# Patient Record
Sex: Female | Born: 1980 | Race: White | Hispanic: No | Smoking: Current every day smoker
Health system: Southern US, Community
[De-identification: ages and names within clinical notes are randomized; demographics above are authoritative.]

## PROBLEM LIST (undated history)

## (undated) DIAGNOSIS — G4700 Insomnia, unspecified: Secondary | ICD-10-CM

## (undated) DIAGNOSIS — R569 Unspecified convulsions: Secondary | ICD-10-CM

## (undated) DIAGNOSIS — J45909 Unspecified asthma, uncomplicated: Secondary | ICD-10-CM

## (undated) DIAGNOSIS — F1111 Opioid abuse, in remission: Secondary | ICD-10-CM

## (undated) HISTORY — PX: TONSILLECTOMY: SUR1361

## (undated) HISTORY — DX: Opioid abuse, in remission: F11.11

## (undated) HISTORY — PX: OTHER SURGICAL HISTORY: SHX169

## (undated) HISTORY — PX: TUBAL LIGATION: SHX77

## (undated) HISTORY — DX: Insomnia, unspecified: G47.00

---
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
## 2002-01-06 ENCOUNTER — Inpatient Hospital Stay (HOSPITAL_COMMUNITY): Admission: AD | Admit: 2002-01-06 | Discharge: 2002-01-06 | Payer: Self-pay | Admitting: Obstetrics

## 2002-02-02 ENCOUNTER — Inpatient Hospital Stay (HOSPITAL_COMMUNITY): Admission: AD | Admit: 2002-02-02 | Discharge: 2002-02-04 | Payer: Self-pay | Admitting: Obstetrics

## 2002-09-07 ENCOUNTER — Encounter: Payer: Self-pay | Admitting: Emergency Medicine

## 2002-09-07 ENCOUNTER — Emergency Department (HOSPITAL_COMMUNITY): Admission: EM | Admit: 2002-09-07 | Discharge: 2002-09-07 | Payer: Self-pay | Admitting: Emergency Medicine

## 2003-01-12 ENCOUNTER — Encounter: Payer: Self-pay | Admitting: Emergency Medicine

## 2003-01-12 ENCOUNTER — Emergency Department (HOSPITAL_COMMUNITY): Admission: EM | Admit: 2003-01-12 | Discharge: 2003-01-12 | Payer: Self-pay | Admitting: Emergency Medicine

## 2003-05-17 ENCOUNTER — Emergency Department (HOSPITAL_COMMUNITY): Admission: EM | Admit: 2003-05-17 | Discharge: 2003-05-17 | Payer: Self-pay | Admitting: Emergency Medicine

## 2003-06-17 ENCOUNTER — Ambulatory Visit (HOSPITAL_COMMUNITY): Admission: RE | Admit: 2003-06-17 | Discharge: 2003-06-17 | Payer: Self-pay | Admitting: Otolaryngology

## 2003-06-17 ENCOUNTER — Encounter (INDEPENDENT_AMBULATORY_CARE_PROVIDER_SITE_OTHER): Payer: Self-pay | Admitting: Specialist

## 2003-06-17 ENCOUNTER — Ambulatory Visit (HOSPITAL_BASED_OUTPATIENT_CLINIC_OR_DEPARTMENT_OTHER): Admission: RE | Admit: 2003-06-17 | Discharge: 2003-06-17 | Payer: Self-pay | Admitting: Otolaryngology

## 2003-08-13 ENCOUNTER — Emergency Department (HOSPITAL_COMMUNITY): Admission: EM | Admit: 2003-08-13 | Discharge: 2003-08-13 | Payer: Self-pay | Admitting: Emergency Medicine

## 2003-09-01 ENCOUNTER — Emergency Department (HOSPITAL_COMMUNITY): Admission: EM | Admit: 2003-09-01 | Discharge: 2003-09-01 | Payer: Self-pay | Admitting: Emergency Medicine

## 2003-09-25 ENCOUNTER — Emergency Department (HOSPITAL_COMMUNITY): Admission: EM | Admit: 2003-09-25 | Discharge: 2003-09-25 | Payer: Self-pay | Admitting: Emergency Medicine

## 2003-10-14 ENCOUNTER — Emergency Department (HOSPITAL_COMMUNITY): Admission: EM | Admit: 2003-10-14 | Discharge: 2003-10-14 | Payer: Self-pay | Admitting: Emergency Medicine

## 2003-11-14 ENCOUNTER — Emergency Department (HOSPITAL_COMMUNITY): Admission: EM | Admit: 2003-11-14 | Discharge: 2003-11-14 | Payer: Self-pay | Admitting: Emergency Medicine

## 2003-12-03 ENCOUNTER — Emergency Department (HOSPITAL_COMMUNITY): Admission: EM | Admit: 2003-12-03 | Discharge: 2003-12-03 | Payer: Self-pay | Admitting: Emergency Medicine

## 2003-12-11 ENCOUNTER — Encounter: Admission: RE | Admit: 2003-12-11 | Discharge: 2003-12-11 | Payer: Self-pay | Admitting: Internal Medicine

## 2004-01-01 ENCOUNTER — Emergency Department (HOSPITAL_COMMUNITY): Admission: EM | Admit: 2004-01-01 | Discharge: 2004-01-01 | Payer: Self-pay | Admitting: Emergency Medicine

## 2004-01-13 ENCOUNTER — Emergency Department (HOSPITAL_COMMUNITY): Admission: EM | Admit: 2004-01-13 | Discharge: 2004-01-13 | Payer: Self-pay | Admitting: Emergency Medicine

## 2004-04-10 ENCOUNTER — Emergency Department (HOSPITAL_COMMUNITY): Admission: EM | Admit: 2004-04-10 | Discharge: 2004-04-10 | Payer: Self-pay | Admitting: Emergency Medicine

## 2004-04-13 ENCOUNTER — Emergency Department (HOSPITAL_COMMUNITY): Admission: EM | Admit: 2004-04-13 | Discharge: 2004-04-13 | Payer: Self-pay | Admitting: Emergency Medicine

## 2004-04-20 ENCOUNTER — Inpatient Hospital Stay (HOSPITAL_COMMUNITY): Admission: AD | Admit: 2004-04-20 | Discharge: 2004-04-20 | Payer: Self-pay | Admitting: Obstetrics

## 2004-09-17 ENCOUNTER — Inpatient Hospital Stay (HOSPITAL_COMMUNITY): Admission: AD | Admit: 2004-09-17 | Discharge: 2004-09-17 | Payer: Self-pay | Admitting: Obstetrics

## 2004-09-20 ENCOUNTER — Inpatient Hospital Stay (HOSPITAL_COMMUNITY): Admission: AD | Admit: 2004-09-20 | Discharge: 2004-09-20 | Payer: Self-pay | Admitting: Obstetrics

## 2004-11-05 ENCOUNTER — Inpatient Hospital Stay (HOSPITAL_COMMUNITY): Admission: AD | Admit: 2004-11-05 | Discharge: 2004-11-05 | Payer: Self-pay | Admitting: Obstetrics

## 2004-11-29 ENCOUNTER — Inpatient Hospital Stay (HOSPITAL_COMMUNITY): Admission: AD | Admit: 2004-11-29 | Discharge: 2004-12-01 | Payer: Self-pay | Admitting: Obstetrics

## 2004-11-30 ENCOUNTER — Encounter (INDEPENDENT_AMBULATORY_CARE_PROVIDER_SITE_OTHER): Payer: Self-pay | Admitting: Specialist

## 2004-12-25 ENCOUNTER — Emergency Department (HOSPITAL_COMMUNITY): Admission: EM | Admit: 2004-12-25 | Discharge: 2004-12-25 | Payer: Self-pay | Admitting: Emergency Medicine

## 2005-04-08 ENCOUNTER — Emergency Department (HOSPITAL_COMMUNITY): Admission: EM | Admit: 2005-04-08 | Discharge: 2005-04-08 | Payer: Self-pay | Admitting: Emergency Medicine

## 2005-04-09 ENCOUNTER — Emergency Department (HOSPITAL_COMMUNITY): Admission: EM | Admit: 2005-04-09 | Discharge: 2005-04-09 | Payer: Self-pay | Admitting: Emergency Medicine

## 2005-06-30 ENCOUNTER — Emergency Department (HOSPITAL_COMMUNITY): Admission: EM | Admit: 2005-06-30 | Discharge: 2005-06-30 | Payer: Self-pay | Admitting: Emergency Medicine

## 2005-10-08 ENCOUNTER — Emergency Department (HOSPITAL_COMMUNITY): Admission: EM | Admit: 2005-10-08 | Discharge: 2005-10-08 | Payer: Self-pay | Admitting: *Deleted

## 2006-01-04 ENCOUNTER — Emergency Department (HOSPITAL_COMMUNITY): Admission: EM | Admit: 2006-01-04 | Discharge: 2006-01-04 | Payer: Self-pay | Admitting: Emergency Medicine

## 2006-01-09 ENCOUNTER — Emergency Department (HOSPITAL_COMMUNITY): Admission: EM | Admit: 2006-01-09 | Discharge: 2006-01-09 | Payer: Self-pay | Admitting: Emergency Medicine

## 2006-02-12 ENCOUNTER — Encounter: Admission: RE | Admit: 2006-02-12 | Discharge: 2006-02-12 | Payer: Self-pay | Admitting: General Practice

## 2006-03-05 ENCOUNTER — Encounter: Admission: RE | Admit: 2006-03-05 | Discharge: 2006-03-05 | Payer: Self-pay | Admitting: General Practice

## 2006-04-22 ENCOUNTER — Emergency Department (HOSPITAL_COMMUNITY): Admission: EM | Admit: 2006-04-22 | Discharge: 2006-04-22 | Payer: Self-pay | Admitting: Emergency Medicine

## 2006-05-02 ENCOUNTER — Encounter
Admission: RE | Admit: 2006-05-02 | Discharge: 2006-07-31 | Payer: Self-pay | Admitting: Physical Medicine & Rehabilitation

## 2006-05-05 ENCOUNTER — Ambulatory Visit: Payer: Self-pay | Admitting: Physical Medicine & Rehabilitation

## 2006-05-09 ENCOUNTER — Encounter
Admission: RE | Admit: 2006-05-09 | Discharge: 2006-08-07 | Payer: Self-pay | Admitting: Physical Medicine & Rehabilitation

## 2006-09-15 ENCOUNTER — Emergency Department (HOSPITAL_COMMUNITY): Admission: EM | Admit: 2006-09-15 | Discharge: 2006-09-15 | Payer: Self-pay | Admitting: Emergency Medicine

## 2006-11-02 ENCOUNTER — Emergency Department (HOSPITAL_COMMUNITY): Admission: EM | Admit: 2006-11-02 | Discharge: 2006-11-02 | Payer: Self-pay | Admitting: Emergency Medicine

## 2006-11-23 ENCOUNTER — Emergency Department (HOSPITAL_COMMUNITY): Admission: EM | Admit: 2006-11-23 | Discharge: 2006-11-23 | Payer: Self-pay | Admitting: Emergency Medicine

## 2006-12-03 ENCOUNTER — Emergency Department (HOSPITAL_COMMUNITY): Admission: EM | Admit: 2006-12-03 | Discharge: 2006-12-03 | Payer: Self-pay | Admitting: Emergency Medicine

## 2007-01-14 ENCOUNTER — Emergency Department (HOSPITAL_COMMUNITY): Admission: EM | Admit: 2007-01-14 | Discharge: 2007-01-14 | Payer: Self-pay | Admitting: Emergency Medicine

## 2007-02-09 ENCOUNTER — Emergency Department (HOSPITAL_COMMUNITY): Admission: EM | Admit: 2007-02-09 | Discharge: 2007-02-09 | Payer: Self-pay | Admitting: Emergency Medicine

## 2007-03-14 ENCOUNTER — Emergency Department (HOSPITAL_COMMUNITY): Admission: EM | Admit: 2007-03-14 | Discharge: 2007-03-14 | Payer: Self-pay | Admitting: Emergency Medicine

## 2007-04-25 ENCOUNTER — Emergency Department (HOSPITAL_COMMUNITY): Admission: EM | Admit: 2007-04-25 | Discharge: 2007-04-25 | Payer: Self-pay | Admitting: Emergency Medicine

## 2007-05-11 ENCOUNTER — Emergency Department (HOSPITAL_COMMUNITY): Admission: EM | Admit: 2007-05-11 | Discharge: 2007-05-11 | Payer: Self-pay | Admitting: Emergency Medicine

## 2007-06-02 ENCOUNTER — Emergency Department (HOSPITAL_COMMUNITY): Admission: EM | Admit: 2007-06-02 | Discharge: 2007-06-02 | Payer: Self-pay | Admitting: Emergency Medicine

## 2007-08-12 ENCOUNTER — Emergency Department (HOSPITAL_COMMUNITY): Admission: EM | Admit: 2007-08-12 | Discharge: 2007-08-12 | Payer: Self-pay | Admitting: Emergency Medicine

## 2007-09-21 ENCOUNTER — Emergency Department (HOSPITAL_COMMUNITY): Admission: EM | Admit: 2007-09-21 | Discharge: 2007-09-21 | Payer: Self-pay | Admitting: Emergency Medicine

## 2007-10-07 ENCOUNTER — Emergency Department (HOSPITAL_COMMUNITY): Admission: EM | Admit: 2007-10-07 | Discharge: 2007-10-07 | Payer: Self-pay | Admitting: Emergency Medicine

## 2007-11-28 ENCOUNTER — Emergency Department (HOSPITAL_COMMUNITY): Admission: EM | Admit: 2007-11-28 | Discharge: 2007-11-28 | Payer: Self-pay | Admitting: Emergency Medicine

## 2007-12-25 ENCOUNTER — Emergency Department (HOSPITAL_COMMUNITY): Admission: EM | Admit: 2007-12-25 | Discharge: 2007-12-25 | Payer: Self-pay | Admitting: Emergency Medicine

## 2008-01-06 ENCOUNTER — Emergency Department (HOSPITAL_COMMUNITY): Admission: EM | Admit: 2008-01-06 | Discharge: 2008-01-06 | Payer: Self-pay | Admitting: Emergency Medicine

## 2008-01-08 ENCOUNTER — Emergency Department (HOSPITAL_COMMUNITY): Admission: EM | Admit: 2008-01-08 | Discharge: 2008-01-08 | Payer: Self-pay | Admitting: Emergency Medicine

## 2008-01-09 ENCOUNTER — Emergency Department (HOSPITAL_COMMUNITY): Admission: EM | Admit: 2008-01-09 | Discharge: 2008-01-09 | Payer: Self-pay | Admitting: Emergency Medicine

## 2008-01-12 ENCOUNTER — Emergency Department (HOSPITAL_COMMUNITY): Admission: EM | Admit: 2008-01-12 | Discharge: 2008-01-12 | Payer: Self-pay | Admitting: Internal Medicine

## 2008-01-22 ENCOUNTER — Emergency Department (HOSPITAL_COMMUNITY): Admission: EM | Admit: 2008-01-22 | Discharge: 2008-01-22 | Payer: Self-pay | Admitting: Emergency Medicine

## 2008-02-18 ENCOUNTER — Inpatient Hospital Stay (HOSPITAL_COMMUNITY): Admission: EM | Admit: 2008-02-18 | Discharge: 2008-02-22 | Payer: Self-pay | Admitting: Emergency Medicine

## 2008-04-02 ENCOUNTER — Emergency Department (HOSPITAL_COMMUNITY): Admission: EM | Admit: 2008-04-02 | Discharge: 2008-04-02 | Payer: Self-pay | Admitting: Emergency Medicine

## 2008-04-05 ENCOUNTER — Emergency Department (HOSPITAL_COMMUNITY): Admission: EM | Admit: 2008-04-05 | Discharge: 2008-04-05 | Payer: Self-pay | Admitting: Emergency Medicine

## 2008-05-23 ENCOUNTER — Emergency Department (HOSPITAL_COMMUNITY): Admission: EM | Admit: 2008-05-23 | Discharge: 2008-05-23 | Payer: Self-pay | Admitting: Emergency Medicine

## 2008-05-29 ENCOUNTER — Emergency Department (HOSPITAL_COMMUNITY): Admission: EM | Admit: 2008-05-29 | Discharge: 2008-05-29 | Payer: Self-pay | Admitting: Emergency Medicine

## 2008-05-31 ENCOUNTER — Emergency Department (HOSPITAL_COMMUNITY): Admission: EM | Admit: 2008-05-31 | Discharge: 2008-05-31 | Payer: Self-pay | Admitting: Family Medicine

## 2008-06-27 ENCOUNTER — Encounter: Admission: RE | Admit: 2008-06-27 | Discharge: 2008-07-11 | Payer: Self-pay | Admitting: Anesthesiology

## 2008-07-05 ENCOUNTER — Emergency Department (HOSPITAL_COMMUNITY): Admission: EM | Admit: 2008-07-05 | Discharge: 2008-07-05 | Payer: Self-pay | Admitting: Emergency Medicine

## 2008-07-07 ENCOUNTER — Emergency Department (HOSPITAL_COMMUNITY): Admission: EM | Admit: 2008-07-07 | Discharge: 2008-07-07 | Payer: Self-pay | Admitting: Emergency Medicine

## 2008-07-09 ENCOUNTER — Emergency Department (HOSPITAL_COMMUNITY): Admission: EM | Admit: 2008-07-09 | Discharge: 2008-07-09 | Payer: Self-pay | Admitting: Emergency Medicine

## 2008-07-17 ENCOUNTER — Emergency Department: Payer: Self-pay | Admitting: Emergency Medicine

## 2008-07-22 ENCOUNTER — Emergency Department: Payer: Self-pay | Admitting: Emergency Medicine

## 2008-07-24 ENCOUNTER — Emergency Department (HOSPITAL_COMMUNITY): Admission: EM | Admit: 2008-07-24 | Discharge: 2008-07-24 | Payer: Self-pay | Admitting: Emergency Medicine

## 2008-08-15 ENCOUNTER — Other Ambulatory Visit: Payer: Self-pay

## 2008-08-15 ENCOUNTER — Other Ambulatory Visit: Payer: Self-pay | Admitting: Emergency Medicine

## 2008-08-16 ENCOUNTER — Inpatient Hospital Stay (HOSPITAL_COMMUNITY): Admission: RE | Admit: 2008-08-16 | Discharge: 2008-08-18 | Payer: Self-pay | Admitting: Psychiatry

## 2008-08-16 ENCOUNTER — Ambulatory Visit: Payer: Self-pay | Admitting: Psychiatry

## 2008-09-27 ENCOUNTER — Emergency Department (HOSPITAL_COMMUNITY): Admission: EM | Admit: 2008-09-27 | Discharge: 2008-09-27 | Payer: Self-pay | Admitting: Emergency Medicine

## 2008-12-09 ENCOUNTER — Emergency Department (HOSPITAL_COMMUNITY): Admission: EM | Admit: 2008-12-09 | Discharge: 2008-12-09 | Payer: Self-pay | Admitting: Emergency Medicine

## 2008-12-22 ENCOUNTER — Emergency Department (HOSPITAL_COMMUNITY): Admission: EM | Admit: 2008-12-22 | Discharge: 2008-12-22 | Payer: Self-pay | Admitting: Emergency Medicine

## 2009-03-01 ENCOUNTER — Emergency Department (HOSPITAL_COMMUNITY): Admission: EM | Admit: 2009-03-01 | Discharge: 2009-03-01 | Payer: Self-pay | Admitting: Emergency Medicine

## 2010-05-20 ENCOUNTER — Encounter: Payer: Self-pay | Admitting: Obstetrics

## 2010-05-20 ENCOUNTER — Encounter: Payer: Self-pay | Admitting: Sports Medicine

## 2010-05-20 ENCOUNTER — Encounter: Payer: Self-pay | Admitting: Internal Medicine

## 2010-05-20 ENCOUNTER — Encounter: Payer: Self-pay | Admitting: General Practice

## 2010-08-08 LAB — COMPREHENSIVE METABOLIC PANEL
AST: 27 U/L (ref 0–37)
Albumin: 3.8 g/dL (ref 3.5–5.2)
Alkaline Phosphatase: 53 U/L (ref 39–117)
BUN: 7 mg/dL (ref 6–23)
Chloride: 104 mEq/L (ref 96–112)
GFR calc Af Amer: >60 mL/min (ref >60)
Potassium: 3.7 mEq/L (ref 3.5–5.1)
Total Bilirubin: 0.4 mg/dL (ref 0.3–1.2)
Total Protein: 6.7 g/dL (ref 6.0–8.3)

## 2010-08-08 LAB — RAPID URINE DRUG SCREEN, HOSP PERFORMED
Amphetamines: NOT DETECTED
Benzodiazepines: NOT DETECTED
Cocaine: NOT DETECTED
Tetrahydrocannabinol: NOT DETECTED

## 2010-08-08 LAB — URINALYSIS, ROUTINE W REFLEX MICROSCOPIC
Bilirubin Urine: NEGATIVE
Ketones, ur: NEGATIVE mg/dL
Nitrite: NEGATIVE
Protein, ur: NEGATIVE mg/dL
Urobilinogen, UA: 0.2 mg/dL (ref 0.0–1.0)
pH: 6 (ref 5.0–8.0)

## 2010-08-08 LAB — CBC
Platelets: 207 10*3/uL (ref 150–400)
RDW: 16.1 % — ABNORMAL HIGH (ref 11.5–15.5)
WBC: 11.1 10*3/uL — ABNORMAL HIGH (ref 4.0–10.5)

## 2010-08-08 LAB — POCT PREGNANCY, URINE: Preg Test, Ur: NEGATIVE

## 2010-08-08 LAB — DIFFERENTIAL
Basophils Absolute: 0 10*3/uL (ref 0.0–0.1)
Basophils Relative: 0 % (ref 0–1)
Eosinophils Relative: 1 % (ref 0–5)
Lymphocytes Relative: 24 % (ref 12–46)
Monocytes Absolute: 0.7 10*3/uL (ref 0.1–1.0)
Monocytes Relative: 6 % (ref 3–12)
Neutro Abs: 7.6 10*3/uL (ref 1.7–7.7)

## 2010-08-08 LAB — ETHANOL: Alcohol, Ethyl (B): <5 mg/dL (ref 0–10)

## 2010-08-09 LAB — POCT I-STAT, CHEM 8
Calcium, Ion: 1.14 mmol/L (ref 1.12–1.32)
HCT: 36 % (ref 36.0–46.0)
Hemoglobin: 12.2 g/dL (ref 12.0–15.0)
TCO2: 25 mmol/L (ref 0–100)

## 2010-08-09 LAB — CULTURE, ROUTINE-ABSCESS

## 2010-08-09 LAB — CBC
Hemoglobin: 11.9 g/dL — ABNORMAL LOW (ref 12.0–15.0)
MCHC: 34.3 g/dL (ref 30.0–36.0)
MCV: 89.9 fL (ref 78.0–100.0)
RBC: 3.86 MIL/uL — ABNORMAL LOW (ref 3.87–5.11)
RDW: 14.9 % (ref 11.5–15.5)

## 2010-08-09 LAB — DIFFERENTIAL
Basophils Absolute: 0 10*3/uL (ref 0.0–0.1)
Basophils Relative: 0 % (ref 0–1)
Eosinophils Absolute: 0 10*3/uL (ref 0.0–0.7)
Monocytes Absolute: 0.8 10*3/uL (ref 0.1–1.0)
Monocytes Relative: 6 % (ref 3–12)
Neutro Abs: 12 10*3/uL — ABNORMAL HIGH (ref 1.7–7.7)

## 2010-09-11 NOTE — H&P (Signed)
[name], [name]              ACCOUNT NO.:  [id number]   MEDICAL RECORD NO.:  [id number]          PATIENT TYPE:  INP   LOCATION:  5002                         FACILITY:  [facility]   PHYSICIAN:  Cherylynn Ridges, M.D.    DATE OF BIRTH:  Feb 11, 1981   DATE OF ADMISSION:  02/18/2008  DATE OF DISCHARGE:                              HISTORY & PHYSICAL   IDENTIFICATION AND CHIEF COMPLAINT:  The patient is a 30 year old with a  large left gluteal abscess.   HISTORY OF PRESENT ILLNESS:  The patient has been having pain in this  area for the last several days.  This punctate area on the surrounded  portion of her left gluteus maximus were drained spontaneously, but it  would continue to build up in spite of drainage.  She has had previous  infections with MRSA throughout her body including her groins and on her  extremities, but never has had a large gluteal abscess.  She comes in  now because of pain.  An ultrasound done by the emergency room  physician, Dr. Georgina Quint demonstrates a large abscess and Surgery was  called.   PAST MEDICAL HISTORY:  Significant for MRSA-1.   PAST SURGICAL HISTORY:  She has had a tubal ligation.  Gynecologically,  obstetrically, she is at least gravida 3, para 3, with 3 live children.   MEDICATIONS:  She takes no medications.   ALLERGIES:  She has no known drug allergies to me, but she told  Anesthesia that she do not tolerate TYLENOL WITH CODEINE.   SOCIAL HISTORY:  she is a smoker, is unknown whether or not she takes  any illicit drugs.  She does drink occasionally.   REVIEW OF SYSTEMS:  She has had pain plus fevers and chills, but not  know what her fevers have been.   On exam in the ED, she is afebrile currently.  Other vital signs are  stable.  She looks like she is in distress, tilted up to her right side  because of pain in the left gluteus maximus.  HEENT:  Normocephalic and atraumatic and anicteric.  NECK:  Supple.  CHEST:  Clear to auscultation.  CARDIAC:  Regular rhythm and rate with no murmurs.  ABDOMEN:  Soft, nontender gluteally.  She has a large left gluteal  abscess which is markedly indurated, not as erythematous as will be  expected.  RECTAL:  No palpable fistula or palpable crypt.   LABORATORY STUDIES:  Pending.   IMPRESSION:  Large left gluteal abscess.   PLAN:  To perform an I and D in the operating room.  Because of her  history of MRSA she will get vancomycin on-call and then go for an  incision and drainage.      Cherylynn Ridges, M.D.  Electronically Signed     JOW/MEDQ  D:  02/18/2008  T:  02/19/2008  Job:  [id number]

## 2010-09-11 NOTE — Discharge Summary (Signed)
[name], [name]              ACCOUNT NO.:  [id number]   MEDICAL RECORD NO.:  [id number]          PATIENT TYPE:  INP   LOCATION:  5002                         FACILITY:  [facility]   PHYSICIAN:  Sharlet Salina T. Hoxworth, M.D.DATE OF BIRTH:  [birth year]   DATE OF ADMISSION:  02/18/2008  DATE OF DISCHARGE:  02/22/2008                               DISCHARGE SUMMARY   DISCHARGE PHYSICIAN:  Sharlet Salina T. Hoxworth, MD   PROCEDURE:  Incision and drainage of large left gluteal abscess by Dr.  Lindie Spruce on February 18, 2008.   CONSULTATIONS:  None.   REASON FOR ADMISSION:  Ms. [name] is a 30 year old white female who  presented to the emergency department with a large left gluteal abscess  which she states has been there for the last several days.  She said  that she had had some portions of this wound drained spontaneously,  however, it did not seem to help and in spite of the drainage, the  abscess continued to grow larger.  She states that she has had previous  infections with MRSA throughout her body including her groin and her  extremities, but never one on her buttock region.  An ultrasound was  done in the emergency department which demonstrated a large abscess and  therefore Surgery was consulted.   ADMITTING DIAGNOSIS:  Large left gluteal abscess, probably methicillin-  resistant Staphylococcus aureus.   HOSPITAL COURSE:  The patient was admitted and started on vancomycin and  then taken to the operating room where an incision and drainage of her  left gluteal abscess was performed.  During the procedure, cultures of  this area were obtained.  On postoperative day one half, the patient was  complaining of some pain, otherwise tolerating a diet and doing well.  At this time, her white count was coming down from admission which was  12.3 and had come down to 8.2.  On her first postoperative day, the  packing was left in.  By postoperative day #2, the patient was  continuing to feel better  and improved.  At this time, her packing was  removed and her wound appeared to be clean.  At this time, she was  started on dressing changes and t.i.d. sitz baths.  Over the next  several days, the patient was continued on vancomycin and wound  dressings were continued.  By postoperative day 4, the patient was ready  to go home.  At this time, her wound was clean with a decrease in  induration and no erythema was present.  At this time, cultures did come  back which revealed that the patient did have methicillin-resistant  staph aureus in her wound.  Therefore at this time, the patient was sent  home on tetracycline as well as home health agency was obtained to help  with dressing changes.   DISCHARGE DIAGNOSES:  1. Left gluteal abscess/methicillin-resistant Staphylococcus aureus.  2. Status post incision and drainage.   DISCHARGE MEDICATIONS:  The patient has no home medications, however,  she was sent home with a prescription for tetracycline 500 mg 1 p.o.  q.i.d. for 7 days  and a prescription for Percocet 1-2 tablets q.4 h. as  needed for pain.   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  She has no dietary  restrictions and no activity restrictions.  She is informed that since  this is a MRSA infection, everything at home needs to be thoroughly  cleaned and wiped down.  She is informed that she may shower and  continue her sitz baths at home, however, she is to take the packing out  prior to doing this.  Otherwise, Home Health nurses are given  instructions for 1/4-inch packing gauze to be packed in this wound once  a day while at home.  The patient is to call our office if she begins to  have any worsening pain or drainage from her wound or fever greater than  101.5.  Otherwise, she is to come back to the [facility] to see either  myself or Everardo All in approximately 2 weeks at 2 o'clock for a wound   check.      Letha Cape, PA      Lorne Skeens. Hoxworth, M.D.  Electronically Signed    KEO/MEDQ  D:  02/22/2008  T:  02/22/2008  Job:  [id number]

## 2010-09-11 NOTE — Op Note (Signed)
NAMEONIYA, MANDARINO              ACCOUNT NO.:  [id number]   MEDICAL RECORD NO.:  [id number]          PATIENT TYPE:  INP   LOCATION:  5002                         FACILITY:  [facility]   PHYSICIAN:  Cherylynn Ridges, M.D.    DATE OF BIRTH:  Jul 16, 1980   DATE OF PROCEDURE:  02/18/2008  DATE OF DISCHARGE:                               OPERATIVE REPORT   PREOPERATIVE DIAGNOSIS:  Large left gluteal abscess.   POSTOPERATIVE DIAGNOSIS:  Large left gluteal abscess.   PROCEDURE:  Incision and drainage of large left gluteal abscess.   SURGEON:  Cherylynn Ridges, MD   ANESTHESIA:  General endotracheal.   ESTIMATED BLOOD LOSS:  Less than 20 mL.   COMPLICATIONS:  None.   CONDITION:  Stable.   FINDINGS:  Large left gluteal abscess, which would allow the packing of  almost two full bottles of half-inch Iodoform Nu gauze.   INDICATIONS FOR OPERATION:  The patient is a 30 year old with a large  left gluteal abscess, which has been building up over the last several  days who comes to the OR for incision and drainage.   OPERATION:  The patient was taken to the operating room, initially  placed in the room in the supine position.  After an adequate  endotracheal anesthetic was administered, she was placed on the  operating table in the prone position and then prepped and draped in  usual sterile manner exposing the left gluteal area.   We incised diagonally across the fluctuant area of the large left  gluteal abscess with a #10 blade into an abscess cavity, which  immediately drained mucopurulent yellow and brownish pus.  We used a  Kelly clamp to open up into the pocket that extended sort of towards the  front and also towards the rectum; however, on digital examination  bimanually could not palpate what appeared to be a fistulous tract.  This appeared to be just an abscess going around the anus, but not a  perirectal fistula.   We opened this abscess widely with a Kelly clamp and irrigated  with  saline solution, sent aerobic and anaerobic cultures, then packed with  almost 2 full bottles of half-inch Iodoform Nu gauze.  A dressing was  then applied and the patient taken to recovery room in stable condition.      Cherylynn Ridges, M.D.  Electronically Signed     JOW/MEDQ  D:  02/18/2008  T:  02/19/2008  Job:  [id number]

## 2010-09-11 NOTE — Discharge Summary (Signed)
[name], [name]              ACCOUNT NO.:  [id number]   MEDICAL RECORD NO.:  [id number]          PATIENT TYPE:  IPS   LOCATION:  0300                          [facility]   PHYSICIAN:  Jasmine Pang, M.D. DATE OF BIRTH:  Jan 03, 1981   DATE OF ADMISSION:  08/16/2008  DATE OF DISCHARGE:  08/18/2008                               DISCHARGE SUMMARY   IDENTIFICATION:  This is a 30 year old single white female who was  admitted on a voluntary basis on August 16, 2008.   HISTORY OF PRESENT ILLNESS:  The patient is here wanting to be detoxed  off her opiates.  She has been using the opiates for the past 6 years.  She states that she is ready to stop.  She has been using Vicodin and  Percocet up to 15 a day with her last use being the day before  admission.  She has had no prior rehab treatment.  She denies any  depression or suicidal thoughts.  She denies any other substance use.  For further admission information, see psychiatric admission assessment.   PHYSICAL FINDINGS:  There were no acute physical or medical problems  noted.  She was fully assessed at the [facility] ED.  Her physical exam  was reviewed with no significant findings.  She did receive Cipro for  urinary tract infection.   LABORATORY DATA:  Urine drug screen is positive for opiates.  Urinalysis  shows moderate leukocytes with 7-10 wbc's.  Glucose was 101.  Pregnancy  test was negative.  Hemoglobin was 11.9 and her hematocrit was 35.9.  Alcohol level was less than 5.   HOSPITAL COURSE:  Upon admission, the patient was started on the  clonidine detox protocol.  She was also started on Ambien 5 mg p.o.  q.h.s. p.r.n. insomnia and Cipro 500 mg p.o. b.i.d. for 3 days.  In  individual sessions upon admission, the patient was alert and oriented  x4.  She was appropriate, pleasant.  She was complaining of withdrawal  symptoms including GI upset with nausea and diarrhea.  She was also  complaining of hot and cold  chills and feelings of anxiety.  She had no  suicidal ideation.  She was tolerating the clonidine detox protocol  well.  On August 17, 2008, the patient was depressed and anxious.  She  was having a bad detox with GI upset, sweats, shaking, and cold chills.  She discussed how she gets her opiates.  On August 18, 2008, mental  status had improved markedly from admission status.  Sleep was good,  appetite was good.  Mood was less depressed, less anxious.  Affect  consistent with mood.  There was no suicidal or homicidal ideation.  No  thoughts of self-injurious behavior.  No auditory or visual  hallucinations.  No paranoia or delusions.  Thoughts were logical and  goal-directed, thought content.  No predominant theme.  Cognitive was  grossly intact.  Insight good, judgment good, impulse control good.  She  was having no problems with her withdrawal protocol today.  She wanted  to go home and was felt to  be safe for discharge.  She will be given the  remainder of her clonidine detox medications with instructions to take  home.   DISCHARGE DIAGNOSES:  Axis I:  Opiate dependence.  Axis II:  None.  Axis III:  Urinary tract infection, being treated.  Axis IV:  Moderate (psychosocial problems related to chronic substance  use).  Axis V:  Global assessment of functioning was 55 at discharge.  GAF was  45 upon admission.  GAF highest past year 60-65.   DISCHARGE PLAN:  There was no specific activity level or dietary  restrictions.   POSTHOSPITAL CARE PLANS:  The patient will go to the [facility].  The patient will complete her clonidine detox protocol.  She  will take clonidine 0.1 mg this evening, then 0.1 mg p.o. b.i.d.  tomorrow, then 0.1 mg daily for the following 2 days, then she will  discontinue it and her detox will be complete.      Jasmine Pang, M.D.  Electronically Signed     BHS/MEDQ  D:  08/18/2008  T:  08/18/2008  Job:  [id number]

## 2010-09-11 NOTE — H&P (Signed)
[name], BEVANS              ACCOUNT NO.:  [id number]   MEDICAL RECORD NO.:  [id number]          PATIENT TYPE:  IPS   LOCATION:  0301                          [facility]  [facility]   PHYSICIAN:  Anselm Jungling, MD  DATE OF BIRTH:  [birth year]   DATE OF ADMISSION:  08/16/2008  DATE OF DISCHARGE:                       PSYCHIATRIC ADMISSION ASSESSMENT   This is on a 30 year old female voluntarily admitted on August 16, 2008.   HISTORY OF PRESENT ILLNESS:  The patient is here wanting to be detoxed  off her opiate use, has been using opiates for the past 6 years.  States  that she is ready to stop.  She has been using Vicodin and Percocet up  to about 15 a day with her last use being yesterday.  She has had no  prior rehab treatment.  Denies any depression or suicidal thoughts.  Again is here to get some help.  She denies any other substance use.   PAST PSYCHIATRIC HISTORY:  First admission to [facility].  No current mental health treatment.  Has no prior hospitalizations.   SOCIAL HISTORY:  A 30 year old female.  She lives in [place].  She is  unemployed.  She has 3 children ages 12, 64 and 3.  Currently lives with  her mother.   FAMILY HISTORY:  Mother and sister both use pills.   ALCOHOL/DRUG HISTORY:  The patient denies any alcohol or other substance  use.   PRIMARY CARE Seichi Kaufhold:  [facility].   MEDICAL PROBLEMS:  History of hip dysplasia.  Denies any other physical  problems.   MEDICATIONS:  Has been prescribed opiates in the past.   DRUG ALLERGIES:  NO KNOWN ALLERGIES.   PHYSICAL EXAMINATION:  GENERAL:  This is a young, well-nourished female  fully assessed at [facility] Emergency Department.  Her physical exam  was reviewed with no significant findings.  She did receive Cipro for  urinary tract infection.  VITAL SIGNS:  Temperature of 98.9, 83 heart rate, 18 respirations, blood  pressure is 132/71.   LABORATORY DATA:  Urine drug screen positive  for opiates.  Urinalysis  shows moderate leukocytes with 7-10 WBCs, glucose of 101.  Pregnancy  test is negative.  Hemoglobin 11.9, hematocrit 35.9.  Alcohol level less  than 5.   MENTAL STATUS EXAM:  The patient is resting in bed at this time.  She is  cooperative with fair eye contact, appropriately dressed.  Speech is  soft spoken and clear.  The patient's mood is guilty and depressed.  The  patient gets sad and teary-eyed when talking about her children.  Thought process are coherent and goal directed.  No evidence of any  delusional statements.  Cognitive function is intact.  Her memory  appears intact.  Judgment and insight are good.   AXIS I:  Opiate dependence.  AXIS II:  Deferred.  AXIS III:  Urinary tract infection.  AXIS IV:  Psychosocial problems related to chronic substance use.  AXIS V:  45.   PLAN:  Our plan is to use the clonidine protocol to aid withdrawal  symptoms.  We will  continue to assess comorbidities and assess her level  of support.  Case manager will assess any follow up programs to prevent  relapse.  The patient will be in the red group.  We will continue with  the Cipro for 3 days for the urinary tract infection.  Her tentative  length of stay at this time is 3-5 days.      Landry Corporal, N.P.      Anselm Jungling, MD  Electronically Signed    JO/MEDQ  D:  08/16/2008  T:  08/16/2008  Job:  [id number]
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
## 2010-09-14 NOTE — Op Note (Signed)
NAMEROMONDA, PARKER              ACCOUNT NO.:  [id number]   MEDICAL RECORD NO.:  [id number]          PATIENT TYPE:  INP   LOCATION:  9108                          FACILITY:  [facility]   PHYSICIAN:  Kathreen Cosier, M.D.DATE OF BIRTH:  [birth year]   DATE OF PROCEDURE:  11/29/2004  DATE OF DISCHARGE:                                 OPERATIVE REPORT   DELIVERY NOTE:  The patient is a 30 year old gravida 3, para 2-0-0-2, at  term, [facility] December 01, 2004, admitted fully dilated and +3 station.  From the  time of admission, fetal heart rate did not go above 100, so a vacuum was  applied through one contraction, no pop-off, delivery effected from an LOA  position.  She had a female, Apgar 9 and 9 __________, the fluid was clear.  The placenta was then delivered spontaneously.       BAM/MEDQ  D:  11/29/2004  T:  11/29/2004  Job:  [id number]

## 2010-09-14 NOTE — Op Note (Signed)
NAMEDENISHIA, CITRO              ACCOUNT NO.:  [id number]   MEDICAL RECORD NO.:  [id number]          PATIENT TYPE:  INP   LOCATION:  9108                          [facility]ACILITY[facility]  [facility]   PHYSICIAN:  Kathreen Cosier, M.D.DATE OF BIRTH:  03-16-1981   DATE OF PROCEDURE:  11/30/2004  DATE OF DISCHARGE:                                 OPERATIVE REPORT   OPERATION/PROCEDURE:  Postpartum tubal ligation under general anesthesia.   DESCRIPTION OF PROCEDURE:  With the patient in the supine position, abdomen  prepped and draped, bladder emptied with a straight catheter.  A midline  subumbilical incision one inch long was made and carried down to the fascia.  The fascia cleaned and grasped with two Kochers.  Fascia and the peritoneum  opened with Mayo scissors.  Left tube grasped in the mid portion with a  Babcock clamp and 0 plain suture placed in the portion of the tube within  the clamp and this was tied.  Approximately one inch of tube transected.  Hemostasis satisfactory.  Procedure done in similar fashion to the other  side.  Lap and sponge counts correct.  Abdomen closed in layers, peritoneum  with continuous suture of 0 chromic, fascia continuous suture of 0 Dexon and  skin closed with subcuticular stitch of 4-0 Monocryl.  Blood loss less than  5 mL.       BAM/MEDQ  D:  11/30/2004  T:  11/30/2004  Job:  [id number]

## 2010-09-14 NOTE — Discharge Summary (Signed)
[name], [name]              ACCOUNT NO.:  [id number]   MEDICAL RECORD NO.:  [id number]          PATIENT TYPE:  INP   LOCATION:  9108                          [facility]   PHYSICIAN:  Kathreen Cosier, M.D.DATE OF BIRTH:  1981-02-10   DATE OF ADMISSION:  11/29/2004  DATE OF DISCHARGE:                                 DISCHARGE SUMMARY   The patient is a 30 year old gravida 3, para 2, 0-0-2, [facility] December 04, 2004,  was admitted in labor and fully dilated to +3 station.  Fetal heart from the  time of admission to the hospital to my presence was never greater than 100.  Vacuum was applied through one contraction and she delivered from the LOA  position of a female, Apgar 9, 9.  No episiotomy or laceration.  The patient  got one does of ampicillin right after delivery. She desired sterilization  and on November 30, 2004, underwent postpartum tubal ligation. On the afternoon  of December 01, 2003, she complained of severe right calf pain.  She was sent  for deep venous studies, which did not show any abnormalities.  The pain  disappeared overnight and was discharged home on the second postpartum day,  ambulatory, on a regular diet, on Tylox for pain, to see me in six weeks.   DISCHARGE DIAGNOSES:  1.  Status post normal vaginal delivery at term.  2.  Post partum tubal ligation.       BAM/MEDQ  D:  12/01/2004  T:  12/01/2004  Job:  [id number]

## 2010-09-14 NOTE — Procedures (Signed)
[name], [name]              ACCOUNT NO.:  [id number]   MEDICAL RECORD NO.:  [id number]          PATIENT TYPE:  REC   LOCATION:  TPC                          FACILITY:  [facility]   PHYSICIAN:  Erick Colace, M.D.DATE OF BIRTH:  1981-04-22   DATE OF PROCEDURE:  08/26/2006  DATE OF DISCHARGE:  08/07/2006                               OPERATIVE REPORT   PROCEDURE:  Right sacroiliac joint injection under fluoroscopic  guidance.   INDICATIONS:  Right lower back, buttock pain with positive Fader  maneuver, pain only partially responsive to medication management.   Informed consent was obtained after describing risks and benefits of  procedure to the patient.  These include bleeding, bruising, infection,  loss of bowel or bladder function, temporary permanent paralysis.  She  elects to proceed and has given written consent.  The patient placed  prone on fluoroscopy table.  Betadine prep, sterile drape.  25 gauge 1-  1/2 inch needle was used to incise skin and subcu tissue 1% lidocaine x2  mL.  A 25-gauge 3-inch spinal needle was inserted fluoroscopic guidance,  AP lateral imaging utilized.  Omnipaque 190 x 0.5 mL demonstrated good  joint arthrogram no evidence of intravascular uptake, then solution  containing 1/2 mL of 40 mg/ mL of Depo-Medrol and 1 mL of 2% lidocaine  were injected.  The patient tolerated procedure well.  Pre and post  injection vitals stable.  Pre injection pain level 8/10 and post  injection pain level 7/10.      Erick Colace, M.D.  Electronically Signed     AEK/MEDQ  D:  08/26/2006 17:03:14  T:  08/27/2006 06:57:48  Job:  [id number]

## 2010-09-14 NOTE — Op Note (Signed)
NAME:  [name], [name]                        ACCOUNT NO.:  [id number]   MEDICAL RECORD NO.:  [id number]                   PATIENT TYPE:  AMB   LOCATION:  DSC                                  FACILITY:  [facility]   PHYSICIAN:  Lucky Cowboy, M.D.                    DATE OF BIRTH:  26-Nov-1980   DATE OF PROCEDURE:  06/17/2003  DATE OF DISCHARGE:                                 OPERATIVE REPORT   PREOPERATIVE DIAGNOSIS:  Chronic tonsillitis.   POSTOPERATIVE DIAGNOSIS:  Chronic tonsillitis.   PROCEDURE:  Tonsillectomy.   SURGEON:  Lucky Cowboy, M.D.   ANESTHESIA:  General.   ESTIMATED BLOOD LOSS:  None.   COMPLICATIONS:  None.   INDICATIONS FOR PROCEDURE:  This patient is a 30 year old female who has  been having problems with tonsillitis for the past 1 1/2 years.  They are  chronically painful.  She is not able to eat well.  She has been seen in the  emergency room  for treatment.  She has infections shortly after antibiotic  therapy.  For these reasons, tonsillectomy is performed.   FINDINGS:  The patient was noted to have 3+ bilateral palatine cryptic  tonsils.   PROCEDURE:  The patient was taken to the operating room and placed on the  table in supine position.  She was then placed under general endotracheal  anesthesia and the table rotated counter clockwise 90 degrees.  The neck was  gently extended using a shoulder roll.  The Crowe-Davis mouth gag with a #3  tongue blade was then placed intraorally, opened, and suspended on the Mayo  stand.  Palpation of the soft palate was without evidence of a submucosal  cleft.  The right palatine tonsil was grasped with Allis clamps and directed  inferomedially.  The Harmonic scalpel was then used to resect the tonsils  staying within the peritonsillar space.  The left tonsil was removed in an  identical fashion.  An NG tube was placed down the esophagus for suctioning  of the gastric contents.  The mouth gag was removed noting no damage to  the teeth or soft tissues.  The table was rotated clockwise 90 degrees to its original position.  The  patient was awakened from anesthesia and extubated in the operating room.  She was taken to the post anesthesia unit in stable condition.  There were  no complications.                                               Lucky Cowboy, M.D.    SJ/MEDQ  D:  06/17/2003  T:  06/18/2003  Job:  [id number]   cc:   Kathreen Cosier, M.D.  [address]  [place]  [place] [address]  Fax: [phone]

## 2011-01-28 LAB — URINE MICROSCOPIC-ADD ON

## 2011-01-28 LAB — URINE CULTURE: Colony Count: >=100000

## 2011-01-28 LAB — URINALYSIS, ROUTINE W REFLEX MICROSCOPIC
Ketones, ur: NEGATIVE
Nitrite: POSITIVE — AB
Protein, ur: 100 — AB

## 2011-01-29 LAB — CBC
HCT: 29.6 — ABNORMAL LOW
HCT: 35.3 — ABNORMAL LOW
Platelets: 250
Platelets: 278
RDW: 15.9 — ABNORMAL HIGH
WBC: 8.2

## 2011-01-29 LAB — ANAEROBIC CULTURE

## 2011-01-29 LAB — VANCOMYCIN, TROUGH: Vancomycin Tr: <5 — ABNORMAL LOW

## 2011-01-29 LAB — BASIC METABOLIC PANEL
Chloride: 105
GFR calc Af Amer: >60
GFR calc non Af Amer: >60
Potassium: 3.9
Sodium: 143

## 2011-01-29 LAB — DIFFERENTIAL
Basophils Absolute: 0
Basophils Relative: 0
Eosinophils Relative: 1
Lymphocytes Relative: 16

## 2011-01-29 LAB — CULTURE, ROUTINE-ABSCESS

## 2011-01-30 LAB — WOUND CULTURE

## 2011-02-07 LAB — DIFFERENTIAL
Basophils Relative: 0
Eosinophils Absolute: 0
Eosinophils Relative: 0
Monocytes Relative: 1 — ABNORMAL LOW
Neutrophils Relative %: 97 — ABNORMAL HIGH

## 2011-02-07 LAB — CBC
HCT: 38.7
MCHC: 33.2
MCV: 89.8
Platelets: 239

## 2011-02-07 LAB — D-DIMER, QUANTITATIVE: D-Dimer, Quant: <0.22

## 2011-09-04 ENCOUNTER — Ambulatory Visit (HOSPITAL_COMMUNITY)
Admission: RE | Admit: 2011-09-04 | Discharge: 2011-09-04 | Disposition: A | Discharge status: Home / Self Care | Payer: Self-pay | Source: Ambulatory Visit | Attending: Psychiatry | Admitting: Psychiatry

## 2011-09-04 DIAGNOSIS — R009 Unspecified abnormalities of heart beat: Secondary | ICD-10-CM

## 2011-09-04 DIAGNOSIS — R9431 Abnormal electrocardiogram [ECG] [EKG]: Secondary | ICD-10-CM | POA: Insufficient documentation

## 2011-12-28 ENCOUNTER — Encounter (HOSPITAL_COMMUNITY): Payer: Self-pay | Admitting: *Deleted

## 2011-12-28 ENCOUNTER — Emergency Department (HOSPITAL_COMMUNITY)
Admission: EM | Admit: 2011-12-28 | Discharge: 2011-12-28 | Disposition: A | Discharge status: Home / Self Care | Payer: Self-pay | Attending: Emergency Medicine | Admitting: Emergency Medicine

## 2011-12-28 DIAGNOSIS — L03317 Cellulitis of buttock: Secondary | ICD-10-CM | POA: Insufficient documentation

## 2011-12-28 DIAGNOSIS — F17200 Nicotine dependence, unspecified, uncomplicated: Secondary | ICD-10-CM | POA: Insufficient documentation

## 2011-12-28 DIAGNOSIS — L0231 Cutaneous abscess of buttock: Secondary | ICD-10-CM | POA: Insufficient documentation

## 2011-12-28 DIAGNOSIS — L0390 Cellulitis, unspecified: Secondary | ICD-10-CM

## 2011-12-28 HISTORY — DX: Unspecified asthma, uncomplicated: J45.909

## 2011-12-28 MED ORDER — IBUPROFEN 800 MG PO TABS
800.0000 mg | ORAL_TABLET | Freq: Once | ORAL | Status: AC
  Administered 2011-12-28: 800 mg via ORAL
  Filled 2011-12-28: qty 1

## 2011-12-28 MED ORDER — CEPHALEXIN 500 MG PO CAPS: 500.0000 mg | ORAL_CAPSULE | Freq: Four times a day (QID) | ORAL | Status: AC

## 2011-12-28 MED ORDER — IBUPROFEN 800 MG PO TABS: 800.0000 mg | ORAL_TABLET | Freq: Three times a day (TID) | ORAL | Status: AC

## 2011-12-28 MED ORDER — SULFAMETHOXAZOLE-TRIMETHOPRIM 800-160 MG PO TABS: 1.0000 | ORAL_TABLET | Freq: Two times a day (BID) | ORAL | Status: AC

## 2011-12-28 NOTE — ED Provider Notes (Signed)
Medical screening examination/treatment/procedure(s) were performed by non-physician practitioner and as supervising physician I was immediately available for consultation/collaboration.  Juliet Rude. Rubin Payor, MD 12/28/11 [phone]

## 2011-12-28 NOTE — ED Provider Notes (Signed)
History     CSN: [id number]  Arrival date & time 12/28/11  4098   First MD Initiated Contact with Patient 12/28/11 0809      Chief Complaint  Patient presents with  . Abscess    (Consider location/radiation/quality/duration/timing/severity/associated sxs/prior treatment) HPI  Patient to ED for abscess to left buttock. She said it started two days ago. She has a history of abscesses that have had to be surgically drained. This time she says she came in early. It hurts her to sit down and to touch the area. It feels warm to the touch. She has not seen any drainage of blood or pus. She had not had vomiting, nausea, diarrhea, fatigue, fevers or chills. NAD/VSS  Past Medical History  Diagnosis Date  . Asthma     Past Surgical History  Procedure Date  . Tubal ligation   . Tonsillectomy   . Abscess surgery     No family history on file.  History  Substance Use Topics  . Smoking status: Current Everyday Smoker  . Smokeless tobacco: Not on file  . Alcohol Use:     OB History    Grav Para Term Preterm Abortions TAB SAB Ect Mult Living                  Review of Systems   HEENT: denies blurry vision or change in hearing PULMONARY: Denies difficulty breathing and SOB CARDIAC: denies chest pain or heart palpitations MUSCULOSKELETAL:  denies being unable to ambulate ABDOMEN AL: denies abdominal pain GU: denies loss of bowel or urinary control NEURO: denies numbness and tingling in extremities SKIN: + rash to buttocks PSYCH: patient denies anxiety or depression. NECK: Pt denies having neck pain     Allergies  Review of patient's allergies indicates no known allergies.  Home Medications   Current Outpatient Rx  Name Route Sig Dispense Refill  . CEPHALEXIN 500 MG PO CAPS Oral Take 1 capsule (500 mg total) by mouth 4 (four) times daily. 28 capsule 0  . IBUPROFEN 800 MG PO TABS Oral Take 1 tablet (800 mg total) by mouth 3 (three) times daily. 21 tablet 0  .  SULFAMETHOXAZOLE-TRIMETHOPRIM 800-160 MG PO TABS Oral Take 1 tablet by mouth every 12 (twelve) hours. 20 tablet 0    BP 111/53  Pulse 72  Temp 98.5 F (36.9 C) (Oral)  Resp 16  SpO2 95%  Physical Exam  Nursing note and vitals reviewed. Constitutional: She appears well-developed and well-nourished. No distress.  HENT:  Head: Normocephalic and atraumatic.  Eyes: Pupils are equal, round, and reactive to light.  Neck: Normal range of motion. Neck supple.  Cardiovascular: Normal rate and regular rhythm.   Pulmonary/Chest: Effort normal.  Abdominal: Soft.  Neurological: She is alert.  Skin: Skin is warm and dry.          Cellulitis is 2 x 3 cm to left buttock. NO fluctuance noted during palpation.      ED Course  Procedures (including critical care time)  Labs Reviewed - No data to display No results found.   1. Cellulitis       MDM  Using ultrasound, no significant fluid collection noted to drain on patient. I did numb the indurated and firm area per patient request. I have discussed with her that this can still transition into abscess and that she needs to return on Monday if getting worse.  Pt started on Bactrim and Keflex. Given 800mg  PO Ibuprofen.  Pt has been advised  of the symptoms that warrant their return to the ED. Patient has voiced understanding and has agreed to follow-up with the PCP or specialist.         Dorthula Matas, PA 12/28/11 [phone]

## 2011-12-28 NOTE — ED Notes (Signed)
Importance of followup if not improved stressed to pt

## 2011-12-28 NOTE — ED Notes (Signed)
PA at bedside for I & D.

## 2011-12-28 NOTE — ED Notes (Signed)
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

## 2011-12-28 NOTE — ED Notes (Signed)
Pt given extra supplies for home to change dressing

## 2012-02-28 ENCOUNTER — Emergency Department (HOSPITAL_COMMUNITY)
Admission: EM | Admit: 2012-02-28 | Discharge: 2012-02-28 | Disposition: A | Discharge status: Home / Self Care | Payer: Self-pay | Attending: Emergency Medicine | Admitting: Emergency Medicine

## 2012-02-28 ENCOUNTER — Encounter (HOSPITAL_COMMUNITY): Payer: Self-pay | Admitting: Family Medicine

## 2012-02-28 DIAGNOSIS — J45909 Unspecified asthma, uncomplicated: Secondary | ICD-10-CM | POA: Insufficient documentation

## 2012-02-28 DIAGNOSIS — L03317 Cellulitis of buttock: Secondary | ICD-10-CM

## 2012-02-28 DIAGNOSIS — F17200 Nicotine dependence, unspecified, uncomplicated: Secondary | ICD-10-CM | POA: Insufficient documentation

## 2012-02-28 DIAGNOSIS — L0231 Cutaneous abscess of buttock: Secondary | ICD-10-CM | POA: Insufficient documentation

## 2012-02-28 MED ORDER — CEPHALEXIN 500 MG PO CAPS: 500.0000 mg | ORAL_CAPSULE | Freq: Three times a day (TID) | ORAL | Status: DC

## 2012-02-28 MED ORDER — CEPHALEXIN 250 MG PO CAPS
500.0000 mg | ORAL_CAPSULE | Freq: Once | ORAL | Status: AC
  Administered 2012-02-28: 500 mg via ORAL
  Filled 2012-02-28: qty 2

## 2012-02-28 MED ORDER — SULFAMETHOXAZOLE-TMP DS 800-160 MG PO TABS
1.0000 | ORAL_TABLET | Freq: Once | ORAL | Status: AC
  Administered 2012-02-28: 1 via ORAL
  Filled 2012-02-28: qty 1

## 2012-02-28 MED ORDER — SULFAMETHOXAZOLE-TRIMETHOPRIM 800-160 MG PO TABS: 1.0000 | ORAL_TABLET | Freq: Two times a day (BID) | ORAL | Status: DC

## 2012-02-28 NOTE — ED Notes (Signed)
Pt ambulated to restroom independently.

## 2012-02-28 NOTE — ED Notes (Signed)
Suture cart at bedside 

## 2012-02-28 NOTE — ED Notes (Signed)
Pt has hx of mrsa and wounds getting infected.  Pt has large boil on rt buttock.  Pt has had same in past.  Pt alert oriented X4

## 2012-02-28 NOTE — ED Notes (Signed)
Pt. Has hx. Of MRSA and  has been treated in the past for it to the point of having surgical interventions. Noticed Monday she had a small pimple on her Right buttock and it has progressively gotten worse over the past few days.

## 2012-02-28 NOTE — ED Provider Notes (Addendum)
History     CSN: [id number]  Arrival date & time 02/28/12  8657   First MD Initiated Contact with Patient 02/28/12 1109      Chief Complaint  Patient presents with  . Wound Check    (Consider location/radiation/quality/duration/timing/severity/associated sxs/prior treatment) HPI Complains of painful right buttock onset 4 days ago.. Mother reports that red swollen area is much improved over the past 2 days, without treatment. Pain is worse with pressing on the area. Improved with remaining still no fever no vomiting no other associated symptoms pain is moderate at present Past Medical History  Diagnosis Date  . Asthma     Past Surgical History  Procedure Date  . Tubal ligation   . Tonsillectomy   . Abscess surgery     No family history on file.  History  Substance Use Topics  . Smoking status: Current Every Day Smoker  . Smokeless tobacco: Not on file  . Alcohol Use:    Former narcotic abuser  OB History    Grav Para Term Preterm Abortions TAB SAB Ect Mult Living                  Review of Systems  Skin: Positive for wound.       Wound of right buttock    Allergies  Review of patient's allergies indicates no known allergies.  Home Medications  No current outpatient prescriptions on file. Home medications methadone BP 131/65  Pulse 64  Temp 98.6 F (37 C) (Oral)  Resp 20  SpO2 95%  Physical Exam  Nursing note and vitals reviewed. Constitutional: She appears well-developed and well-nourished.  HENT:  Head: Normocephalic and atraumatic.  Eyes: Conjunctivae normal are normal. Pupils are equal, round, and reactive to light.  Neck: Neck supple. No tracheal deviation present. No thyromegaly present.  Cardiovascular: Normal rate and regular rhythm.   No murmur heard. Pulmonary/Chest: Effort normal and breath sounds normal.  Abdominal: Soft. Bowel sounds are normal. She exhibits no distension. There is no tenderness.  Musculoskeletal: Normal range of  motion. She exhibits no edema and no tenderness.  Neurological: She is alert. Coordination normal.  Skin: Skin is warm and dry. No rash noted.       There is a dime-sized open wound to right buttock surrounded by baseball sized cellulitic area. Tender no swelling or tenderness at Peri- rectal area  Psychiatric: She has a normal mood and affect.    ED Course  Procedures (including critical care time)  Labs Reviewed - No data to display No results found. I applied ultrasound of the right proximal, no definite fluid collection  No diagnosis found.  Declined pain medicine in the ED  MDM  Patient may have had a was restrained spontaneously as she's did note some drainage from the open area at the center for wound Plan prescriptions Bactrim DS, Keflex, wound check 2 days Diagnosis cellulitis right buttock        Doug Sou, MD 02/28/12 1148  Doug Sou, MD 02/28/12 1151

## 2012-02-28 NOTE — ED Notes (Signed)
Discharge instructions reviewed. Pt verbalized understanding.  

## 2012-12-25 ENCOUNTER — Emergency Department (HOSPITAL_COMMUNITY)
Admission: EM | Admit: 2012-12-25 | Discharge: 2012-12-25 | Disposition: A | Discharge status: Home / Self Care | Payer: Self-pay | Attending: Emergency Medicine | Admitting: Emergency Medicine

## 2012-12-25 ENCOUNTER — Encounter (HOSPITAL_COMMUNITY): Payer: Self-pay | Admitting: Cardiology

## 2012-12-25 DIAGNOSIS — K029 Dental caries, unspecified: Secondary | ICD-10-CM | POA: Insufficient documentation

## 2012-12-25 DIAGNOSIS — F17200 Nicotine dependence, unspecified, uncomplicated: Secondary | ICD-10-CM | POA: Insufficient documentation

## 2012-12-25 DIAGNOSIS — K0889 Other specified disorders of teeth and supporting structures: Secondary | ICD-10-CM | POA: Diagnosis present

## 2012-12-25 DIAGNOSIS — K089 Disorder of teeth and supporting structures, unspecified: Secondary | ICD-10-CM | POA: Insufficient documentation

## 2012-12-25 DIAGNOSIS — J45909 Unspecified asthma, uncomplicated: Secondary | ICD-10-CM | POA: Insufficient documentation

## 2012-12-25 MED ORDER — OXYCODONE-ACETAMINOPHEN 5-325 MG PO TABS: 1.0000 | ORAL_TABLET | Freq: Four times a day (QID) | ORAL | Status: DC | PRN

## 2012-12-25 MED ORDER — ALBUTEROL SULFATE HFA 108 (90 BASE) MCG/ACT IN AERS
4.0000 | INHALATION_SPRAY | Freq: Once | RESPIRATORY_TRACT | Status: AC
  Administered 2012-12-25: 4 via RESPIRATORY_TRACT
  Filled 2012-12-25: qty 6.7

## 2012-12-25 MED ORDER — PENICILLIN V POTASSIUM 500 MG PO TABS: 500.0000 mg | ORAL_TABLET | Freq: Four times a day (QID) | ORAL | Status: AC

## 2012-12-25 MED ORDER — PENICILLIN V POTASSIUM 250 MG PO TABS
500.0000 mg | ORAL_TABLET | Freq: Once | ORAL | Status: AC
  Administered 2012-12-25: 500 mg via ORAL
  Filled 2012-12-25: qty 2

## 2012-12-25 MED ORDER — OXYCODONE-ACETAMINOPHEN 5-325 MG PO TABS
1.0000 | ORAL_TABLET | Freq: Once | ORAL | Status: AC
  Administered 2012-12-25: 1 via ORAL
  Filled 2012-12-25: qty 1

## 2012-12-25 NOTE — ED Notes (Signed)
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 States that she wears dentures in the top of her mouth. Reports she noticed swelling to the left side of her mouth last night.

## 2012-12-25 NOTE — ED Provider Notes (Signed)
CSN: [id number]     Arrival date & time 12/25/12  0803 History   First MD Initiated Contact with Patient 12/25/12 0805     Chief Complaint  Patient presents with  . Facial Swelling   (Consider location/radiation/quality/duration/timing/severity/associated sxs/prior Treatment) Patient is a 32 y.o. female presenting with tooth pain. The history is provided by the patient.  Dental Pain Location:  Lower Lower teeth location: lower central, lateral incisors, and bicuspids. Quality:  Aching Severity:  Moderate Onset quality:  Gradual Duration:  2 days Timing:  Constant Progression:  Worsening Chronicity:  Recurrent Context: dental caries and poor dentition   Relieved by:  Nothing Worsened by:  Nothing tried Ineffective treatments:  NSAIDs Associated symptoms: no congestion, no drooling, no fever, no headaches, no neck pain and no trismus     Past Medical History  Diagnosis Date  . Asthma    Past Surgical History  Procedure Laterality Date  . Tubal ligation    . Tonsillectomy    . Abscess surgery     History reviewed. No pertinent family history. History  Substance Use Topics  . Smoking status: Current Every Day Smoker    Types: Cigarettes  . Smokeless tobacco: Not on file  . Alcohol Use: No   OB History   Grav Para Term Preterm Abortions TAB SAB Ect Mult Living                 Review of Systems  Constitutional: Negative for fever and fatigue.  HENT: Negative for congestion, drooling and neck pain.   Eyes: Negative for pain.  Respiratory: Negative for cough and shortness of breath.   Cardiovascular: Negative for chest pain.  Gastrointestinal: Negative for nausea, vomiting, abdominal pain and diarrhea.  Genitourinary: Negative for dysuria and hematuria.  Musculoskeletal: Negative for back pain and gait problem.  Skin: Negative for color change.  Neurological: Negative for dizziness and headaches.  Hematological: Negative for adenopathy.  Psychiatric/Behavioral:  Negative for behavioral problems.  All other systems reviewed and are negative.    Allergies  Review of patient's allergies indicates no known allergies.  Home Medications   Current Outpatient Rx  Name  Route  Sig  Dispense  Refill  . albuterol (PROVENTIL HFA;VENTOLIN HFA) 108 (90 BASE) MCG/ACT inhaler   Inhalation   Inhale 2 puffs into the lungs every 6 (six) hours as needed for wheezing or shortness of breath.         Marland Kitchen ibuprofen (ADVIL,MOTRIN) 200 MG tablet   Oral   Take 400 mg by mouth every 6 (six) hours as needed for pain.          BP 140/72  Pulse 75  Temp(Src) 98.1 F (36.7 C) (Oral)  Resp 18  SpO2 98% Physical Exam  Nursing note and vitals reviewed. Constitutional: She is oriented to person, place, and time. She appears well-developed and well-nourished.  HENT:  Head: Normocephalic.  Mouth/Throat: Oropharynx is clear and moist. No oropharyngeal exudate.  Upper dentures.   Lower teeth w/ extremely poor dentition and dental caries. The only lower teeth intact are the central, lateral incisors, canines, and 1st bicuspid on the left.   Mild swelling to the body of the left jaw w/ mild to mod ttp. No trismus. Normal rom of neck. Otherwise normal appearing oropharynx w/out evidence of abscess.   No cervical adenopathy noted.   Eyes: Conjunctivae and EOM are normal. Pupils are equal, round, and reactive to light.  Neck: Normal range of motion. Neck supple.  Cardiovascular:  Normal rate, regular rhythm, normal heart sounds and intact distal pulses.  Exam reveals no gallop and no friction rub.   No murmur heard. Pulmonary/Chest: Effort normal and breath sounds normal. No respiratory distress. She has no wheezes.  Abdominal: Soft. Bowel sounds are normal. There is no tenderness. There is no rebound and no guarding.  Musculoskeletal: Normal range of motion. She exhibits no edema and no tenderness.  Neurological: She is alert and oriented to person, place, and time.   Skin: Skin is warm and dry.  Psychiatric: She has a normal mood and affect. Her behavior is normal.    ED Course  Procedures (including critical care time) Labs Review Labs Reviewed - No data to display Imaging Review No results found.  MDM   1. Pain, dental    8:23 AM 32 y.o. female w hx of gum disease pw tooth pain and swelling to body of left jaw. Pt denies N/V/D/F. Is AFVSS here, appears well on exam. Normal rom of neck, no trismus.    Provided resources for dentists and recommend close f/u.  I have discussed the diagnosis/risks/treatment options with the patient and believe the pt to be eligible for discharge home to follow-up with a dentist asap. We also discussed returning to the ED immediately if new or worsening sx occur. We discussed the sx which are most concerning (e.g., worsening pain, fever, inc swelling) that necessitate immediate return. Any new prescriptions provided to the patient are listed below.  Discharge Medication List as of 12/25/2012  8:35 AM    START taking these medications   Details  oxyCODONE-acetaminophen (PERCOCET) 5-325 MG per tablet Take 1 tablet by mouth every 6 (six) hours as needed for pain., Starting 12/25/2012, Until Discontinued, Print    penicillin v potassium (VEETID) 500 MG tablet Take 1 tablet (500 mg total) by mouth 4 (four) times daily., Starting 12/25/2012, Last dose on Fri 01/01/13, Print           Junius Argyle, MD 12/25/12 1410

## 2015-07-24 ENCOUNTER — Ambulatory Visit (INDEPENDENT_AMBULATORY_CARE_PROVIDER_SITE_OTHER): Payer: Medicaid Other | Admitting: Internal Medicine

## 2015-07-24 ENCOUNTER — Encounter: Payer: Self-pay | Admitting: Internal Medicine

## 2015-07-24 VITALS — BP 109/59 | HR 55 | Temp 98.3°F | Ht 66.0 in | Wt 147.3 lb

## 2015-07-24 DIAGNOSIS — Z0000 Encounter for general adult medical examination without abnormal findings: Secondary | ICD-10-CM

## 2015-07-24 DIAGNOSIS — J4540 Moderate persistent asthma, uncomplicated: Secondary | ICD-10-CM

## 2015-07-24 MED ORDER — ALBUTEROL SULFATE HFA 108 (90 BASE) MCG/ACT IN AERS: 2.0000 | INHALATION_SPRAY | Freq: Four times a day (QID) | RESPIRATORY_TRACT | Status: DC | PRN

## 2015-07-24 MED ORDER — BECLOMETHASONE DIPROPIONATE 40 MCG/ACT IN AERS: 2.0000 | INHALATION_SPRAY | Freq: Two times a day (BID) | RESPIRATORY_TRACT | Status: DC

## 2015-07-24 NOTE — Assessment & Plan Note (Signed)
With daily symptoms of dyspnea and wheezing, limitation of normal activities 2/2 dyspnea, and daily use of albuterol. Not currently experiencing nighttime awakenings.  - Begin QVAR 2 puffs BID. Can consider increasing dose if necessary at next appointment.  - Continue albuterol ProAir PRN - F/u on resp status at pap smear f/u

## 2015-07-24 NOTE — Progress Notes (Signed)
35 y.o. year old female presents for well woman/preventative visit and annual GYN examination.  Acute Concerns: Asthma Patient has recently had dental problems, and was scheduled to have her bottom teeth pulled (already has dentures on top), however she reports that her dentist listened to her lungs and refused to complete the procedure until her asthma was better controlled for fear that her lungs may collapse. Patient reports asthma diagnosis seven years ago, but says her symptoms have never been well-controlled. She has only been prescribed an albuterol inhaler in the past. She reports daily dyspnea, especially with exertion. She also endorses daily wheezing. She has been using albuterol about twice daily with no relief in symptoms. She has never been to the ED or hospitalization for an asthma exacerbation. She has no nighttime awakening. She denies seasonal allergies.   Diet: Has been eating a lot of sugar recently. Mostly eats meals cooked by her mother, which always include vegetables. Rarely eats fast food. Drink regular Coke daily.   Exercise: No current physical activity other than playing with her children.   Sexual/Birth History: Not currenty sexually active G3P3  Birth Control: BTL in 2006   Social:  Social History   Social History  . Marital Status: Single    Spouse Name: N/A  . Number of Children: N/A  . Years of Education: N/A   Social History Main Topics  . Smoking status: Current Every Day Smoker -- 0.50 packs/day    Types: Cigarettes  . Smokeless tobacco: None  . Alcohol Use: No  . Drug Use: No  . Sexual Activity: Not Currently    Birth Control/ Protection: Surgical   Other Topics Concern  . None   Social History Narrative  Currently smoking half a pack a day - has cut down from whole pack due to breathing difficulties.  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   Immunization:  There is no immunization history  on file for this patient.  Cancer Screening:  Pap Smear: declined as patient is currently menstruating   Mammogram: not indicated  Colonoscopy: not indicated  Dexa: not indicated    Physical Exam: VITALS: Reviewed GEN: Pleasant female, NAD HEENT: Normocephalic, PERRL, EOMI, no scleral icterus, nasal septum midline, MMM, uvula midline, no anterior or posterior lymphadenopathy; top dentures, multiple pulled teeth on bottom with visible caries in many remaining teeth CARDIAC: RRR, S1 and S2 present, no murmur, no heaves/thrills RESP: inspiratory wheezes bilatearlly, no rhales or rhonchi, good air movement, no respiratory distress ABD: Soft, no tenderness, normal bowel sounds GU/GYN: Declined EXT: No edema, 2+ radial and DP pulses SKIN: No rash  ASSESSMENT & PLAN: 35 y.o. female presents for annual well woman/preventative exam and GYN exam. Please see problem specific assessment and plan.   Asthma, moderate persistent, poorly-controlled With daily symptoms of dyspnea and wheezing, limitation of normal activities 2/2 dyspnea, and daily use of albuterol. Not currently experiencing nighttime awakenings.  - Begin QVAR 40mcg 2 puffs BID. Can consider increasing dose if necessary at next appointment.  - Continue albuterol ProAir PRN - F/u on resp status at pap smear f/u  Routine health maintenance Due for pap smear today. Patient declines as she is currently menstruating. Reports that she will schedule a f/u appointment for pap at her earliest convenience.  - Pap at next appointment    [name] AbernethyAbigail J Lynnlee Revels, MD PGY-1 [facility] Pager [phone]

## 2015-07-24 NOTE — Patient Instructions (Addendum)
It was nice meeting you today [name].   For your asthma, please begin using the QVAR inhaler every day. Inhale two puffs twice a day (once in the morning and once in the evening), regardless of your breathing symptoms. If you have difficulty breathing, you can use the albuterol rescue inhaler.   I will see you back for a pap smear at your earliest convenience, and follow-up on your breathing at that time.   Be well,  Dr. Natale MilchLancaster

## 2015-07-24 NOTE — Assessment & Plan Note (Signed)
Due for pap smear today. Patient declines as she is currently menstruating. Reports that she will schedule a f/u appointment for pap at her earliest convenience.  - Pap at next appointment

## 2015-11-27 ENCOUNTER — Other Ambulatory Visit: Payer: Self-pay | Admitting: *Deleted

## 2015-11-27 MED ORDER — ALBUTEROL SULFATE HFA 108 (90 BASE) MCG/ACT IN AERS: 2.0000 | INHALATION_SPRAY | Freq: Four times a day (QID) | RESPIRATORY_TRACT | 2 refills | Status: DC | PRN

## 2017-06-02 ENCOUNTER — Emergency Department (HOSPITAL_COMMUNITY): Admission: EM | Admit: 2017-06-02 | Discharge: 2017-06-02 | Discharge status: Against Medical Advice | Payer: Self-pay

## 2017-06-02 ENCOUNTER — Other Ambulatory Visit: Payer: Self-pay

## 2017-06-24 ENCOUNTER — Emergency Department (HOSPITAL_COMMUNITY)
Admission: EM | Admit: 2017-06-24 | Discharge: 2017-06-24 | Disposition: A | Discharge status: Home / Self Care | Payer: Medicaid Other | Attending: Physician Assistant | Admitting: Physician Assistant

## 2017-06-24 DIAGNOSIS — Z79899 Other long term (current) drug therapy: Secondary | ICD-10-CM | POA: Diagnosis not present

## 2017-06-24 DIAGNOSIS — M546 Pain in thoracic spine: Secondary | ICD-10-CM | POA: Insufficient documentation

## 2017-06-24 DIAGNOSIS — F17210 Nicotine dependence, cigarettes, uncomplicated: Secondary | ICD-10-CM | POA: Diagnosis not present

## 2017-06-24 DIAGNOSIS — J4540 Moderate persistent asthma, uncomplicated: Secondary | ICD-10-CM | POA: Diagnosis not present

## 2017-06-24 DIAGNOSIS — M62830 Muscle spasm of back: Secondary | ICD-10-CM | POA: Insufficient documentation

## 2017-06-24 MED ORDER — METHOCARBAMOL 500 MG PO TABS
1000.0000 mg | ORAL_TABLET | Freq: Once | ORAL | Status: AC
  Administered 2017-06-24: 1000 mg via ORAL
  Filled 2017-06-24: qty 2

## 2017-06-24 MED ORDER — NAPROXEN 500 MG PO TABS: 500.0000 mg | ORAL_TABLET | Freq: Two times a day (BID) | ORAL | 0 refills | Status: DC

## 2017-06-24 MED ORDER — OXYCODONE-ACETAMINOPHEN 5-325 MG PO TABS
1.0000 | ORAL_TABLET | Freq: Once | ORAL | Status: AC
  Administered 2017-06-24: 1 via ORAL
  Filled 2017-06-24: qty 1

## 2017-06-24 MED ORDER — METHOCARBAMOL 500 MG PO TABS: 500.0000 mg | ORAL_TABLET | Freq: Two times a day (BID) | ORAL | 0 refills | Status: DC

## 2017-06-24 NOTE — Discharge Instructions (Signed)
Your pain is likely due to muscle cramping and spasm in your back, please use Robaxin and Naprosyn to help with this, I also encourage you to use ice and heat and take it easy over the next 2 days with some light stretching.  If you develop any chest pain shortness of breath or difficulty breathing please return to the ED for reevaluation otherwise if pain is not improving after several days of this conservative treatment please follow-up with primary care.

## 2017-06-24 NOTE — ED Triage Notes (Signed)
Pt reports upper back radiating down back. Works in [employer] and does lots of lifting. Feels like tightening, spasming. Worse with deep inspiration. Smoker. No BC pills. No dyspnea noted in triage; no signs of distress.

## 2017-06-24 NOTE — ED Provider Notes (Signed)
[facility] EMERGENCY DEPARTMENT Provider Note   CSN: [id number] Arrival date & time: 06/24/17  0831     History   Chief Complaint Chief Complaint  Patient presents with  . Back Pain    HPI  [name] is a 37 y.o. Female history of asthma and tobacco use, presents to the ED for evaluation of 2 days of bilateral thoracic back pain.  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  Patient reports it feels like tightness and muscle spasms across her back and shoulders.  Patient reports pain has been constant without improvement, she did try some ibuprofen and heat.  Patient denies any chest pain shortness of breath or difficulty breathing, she does report some intensity of the spasm with very deep inspiration.  Patient is a smoker and does have a history of asthma with occasional wheezing, reports she rarely uses her inhaler.  She denies any fevers or chills, cough, hemoptysis.  She denies any abdominal pain or dysuria.  Patient denies any recent long distance travel or surgeries, no pain or swelling in the lower extremities, no exogenous estrogen use no personal or family history of DVT/PE.      Past Medical History:  Diagnosis Date  . Asthma     Patient Active Problem List   Diagnosis Date Noted  . Asthma, moderate persistent, poorly-controlled 07/24/2015  . Routine health maintenance 07/24/2015  . Pain, dental 12/25/2012    Past Surgical History:  Procedure Laterality Date  . abscess surgery    . TONSILLECTOMY    . TUBAL LIGATION      OB History    No data available       Home Medications    Prior to Admission medications   Medication Sig Start Date End Date Taking? Authorizing Provider  albuterol (PROVENTIL HFA;VENTOLIN HFA) 108 (90 Base) MCG/ACT inhaler Inhale 2 puffs into the lungs every 6 (six) hours as needed for wheezing or shortness of  breath. 11/27/15   Marquette Saa, MD  beclomethasone (QVAR) 40 MCG/ACT inhaler Inhale 2 puffs into the lungs 2 (two) times daily. 07/24/15   Marquette Saa, MD  ibuprofen (ADVIL,MOTRIN) 200 MG tablet Take 400 mg by mouth every 6 (six) hours as needed for pain.    [provider]  oxyCODONE-acetaminophen (PERCOCET) 5-325 MG per tablet Take 1 tablet by mouth every 6 (six) hours as needed for pain. 12/25/12   Purvis Sheffield, MD    Family History No family history on file.  Social History Social History   Tobacco Use  . Smoking status: Current Every Day Smoker    Packs/day: 0.50    Types: Cigarettes  Substance Use Topics  . Alcohol use: No  . Drug use: No     Allergies   Patient has no known allergies.   Review of Systems Review of Systems  Constitutional: Negative for chills and fever.  HENT: Negative for congestion, rhinorrhea and sore throat.   Respiratory: Negative for cough, chest tightness, shortness of breath and wheezing.   Cardiovascular: Negative for chest pain and leg swelling.  Gastrointestinal: Negative for abdominal pain, nausea and vomiting.  Genitourinary: Negative for dysuria.  Musculoskeletal: Positive for back pain and myalgias. Negative for arthralgias, gait problem, joint swelling, neck pain and neck stiffness.  Neurological: Negative for weakness and numbness.     Physical Exam Updated Vital Signs BP 128/80 (  BP Location: Right Arm)   Pulse (!) 54   Temp 98.7 F (37.1 C) (Oral)   Resp 18   Ht 5\' 6"  (1.676 m)   Wt 74.8 kg (165 lb)   LMP 06/10/2017 (Approximate)   SpO2 99%   BMI 26.63 kg/m   Physical Exam  Constitutional: She appears well-developed and well-nourished. No distress.  HENT:  Head: Normocephalic and atraumatic.  Eyes: Right eye exhibits no discharge. Left eye exhibits no discharge.  Neck: Normal range of motion. Neck supple.  Cardiovascular: Normal rate, regular rhythm, normal heart sounds and  intact distal pulses.  Pulmonary/Chest: Effort normal. No stridor. No respiratory distress. She has wheezes. She has no rales.  Respirations equal and unlabored, patient able to speak in full sentences, bilateral lungs with a few scattered faint expiratory wheezes, otherwise clear with good air movement throughout.  No tenderness over the anterior chest wall or lateral ribs  Abdominal: Soft. Bowel sounds are normal. She exhibits no distension and no mass. There is no tenderness. There is no guarding.  Musculoskeletal:  No tenderness to palpation at midline of the C-spine, T-spine or L-spine, mild tenderness bilaterally over the T-spine, worse with bending and twisting, notable muscle tension on palpation  Neurological: She is alert. Coordination normal.  Skin: Skin is warm and dry. Capillary refill takes less than 2 seconds. She is not diaphoretic.  Psychiatric: She has a normal mood and affect. Her behavior is normal.  Nursing note and vitals reviewed.    ED Treatments / Results  Labs (all labs ordered are listed, but only abnormal results are displayed) Labs Reviewed - No data to display  EKG  EKG Interpretation None       Radiology No results found.  Procedures Procedures (including critical care time)  Medications Ordered in ED Medications  oxyCODONE-acetaminophen (PERCOCET/ROXICET) 5-325 MG per tablet 1 tablet (1 tablet Oral Given 06/24/17 0928)  methocarbamol (ROBAXIN) tablet 1,000 mg (1,000 mg Oral Given 06/24/17 [id number])     Initial Impression / Assessment and Plan / ED Course  I have reviewed the triage vital signs and the nursing notes.  Pertinent labs & imaging results that were available during my care of the patient were reviewed by me and considered in my medical decision making (see chart for details).  Patient presents with thoracic back pain and spasm for 2 days.  Vitals normal and patient appears to be in no acute distress.  No increased work of breathing or  shortness of breath lungs with few faint expiratory wheezes I feel this is likely patient's baseline with history of smoking and asthma, no tachypnea, hypoxia or tachycardia.  Patient is PERC negative and given story of very low suspicion for PE.  No associated fevers or chills, chest pain or abdominal pain.  No dysuria.  Normal neurologic exam.  Will treat with pain medication and muscle relaxers here in the ED, pain improved here today.  Patient stable for discharge with NSAIDs and muscle relaxers, encouraged to use ice and heat as well and follow-up with her primary care doctor.  Strict return precautions discussed.  Final Clinical Impressions(s) / ED Diagnoses   Final diagnoses:  Muscle spasm of back  Acute bilateral thoracic back pain    ED Discharge Orders        Ordered    naproxen (NAPROSYN) 500 MG tablet  2 times daily     06/24/17 1004    methocarbamol (ROBAXIN) 500 MG tablet  2 times daily  06/24/17 1004       Dartha Lodge, PA-C 06/24/17 1006    Abelino Derrick, MD 07/02/17 2011

## 2017-07-28 ENCOUNTER — Other Ambulatory Visit: Payer: Self-pay

## 2017-07-28 ENCOUNTER — Observation Stay (HOSPITAL_COMMUNITY)
Admission: EM | Admit: 2017-07-28 | Discharge: 2017-07-29 | Disposition: A | Discharge status: Home / Self Care | Payer: Medicaid Other | Attending: Internal Medicine | Admitting: Internal Medicine

## 2017-07-28 ENCOUNTER — Encounter (HOSPITAL_COMMUNITY): Payer: Self-pay

## 2017-07-28 DIAGNOSIS — N390 Urinary tract infection, site not specified: Secondary | ICD-10-CM

## 2017-07-28 DIAGNOSIS — F17210 Nicotine dependence, cigarettes, uncomplicated: Secondary | ICD-10-CM | POA: Insufficient documentation

## 2017-07-28 DIAGNOSIS — Z823 Family history of stroke: Secondary | ICD-10-CM | POA: Diagnosis not present

## 2017-07-28 DIAGNOSIS — N10 Acute tubulo-interstitial nephritis: Secondary | ICD-10-CM | POA: Diagnosis not present

## 2017-07-28 DIAGNOSIS — Z9851 Tubal ligation status: Secondary | ICD-10-CM

## 2017-07-28 DIAGNOSIS — Z8249 Family history of ischemic heart disease and other diseases of the circulatory system: Secondary | ICD-10-CM | POA: Diagnosis not present

## 2017-07-28 DIAGNOSIS — R891 Abnormal level of hormones in specimens from other organs, systems and tissues: Secondary | ICD-10-CM | POA: Diagnosis not present

## 2017-07-28 DIAGNOSIS — E876 Hypokalemia: Secondary | ICD-10-CM

## 2017-07-28 DIAGNOSIS — J45909 Unspecified asthma, uncomplicated: Secondary | ICD-10-CM | POA: Diagnosis not present

## 2017-07-28 DIAGNOSIS — Z79899 Other long term (current) drug therapy: Secondary | ICD-10-CM

## 2017-07-28 DIAGNOSIS — B9689 Other specified bacterial agents as the cause of diseases classified elsewhere: Secondary | ICD-10-CM | POA: Diagnosis not present

## 2017-07-28 DIAGNOSIS — N12 Tubulo-interstitial nephritis, not specified as acute or chronic: Secondary | ICD-10-CM | POA: Diagnosis present

## 2017-07-28 LAB — URINALYSIS, MICROSCOPIC (REFLEX)

## 2017-07-28 LAB — URINALYSIS, ROUTINE W REFLEX MICROSCOPIC
GLUCOSE, UA: NEGATIVE mg/dL
Ketones, ur: NEGATIVE mg/dL
Nitrite: POSITIVE — AB
PH: 6 (ref 5.0–8.0)
PROTEIN: 100 mg/dL — AB
Specific Gravity, Urine: 1.02 (ref 1.005–1.030)

## 2017-07-28 LAB — CBC
HEMATOCRIT: 33.5 % — AB (ref 36.0–46.0)
HEMOGLOBIN: 10.8 g/dL — AB (ref 12.0–15.0)
MCH: 26.7 pg (ref 26.0–34.0)
MCHC: 32.2 g/dL (ref 30.0–36.0)
MCV: 82.7 fL (ref 78.0–100.0)
PLATELETS: 170 10*3/uL (ref 150–400)
RBC: 4.05 MIL/uL (ref 3.87–5.11)
RDW: 15.4 % (ref 11.5–15.5)
WBC: 10.3 10*3/uL (ref 4.0–10.5)

## 2017-07-28 LAB — BASIC METABOLIC PANEL
ANION GAP: 10 (ref 5–15)
BUN: 8 mg/dL (ref 6–20)
CHLORIDE: 102 mmol/L (ref 101–111)
CO2: 23 mmol/L (ref 22–32)
Calcium: 9 mg/dL (ref 8.9–10.3)
Creatinine, Ser: 0.87 mg/dL (ref 0.44–1.00)
GFR calc non Af Amer: >60 mL/min (ref >60)
Glucose, Bld: 135 mg/dL — ABNORMAL HIGH (ref 65–99)
POTASSIUM: 3 mmol/L — AB (ref 3.5–5.1)
SODIUM: 135 mmol/L (ref 135–145)

## 2017-07-28 LAB — I-STAT BETA HCG BLOOD, ED (MC, WL, AP ONLY): I-stat hCG, quantitative: 7.3 m[IU]/mL — ABNORMAL HIGH (ref <5)

## 2017-07-28 MED ORDER — ACETAMINOPHEN 325 MG PO TABS
650.0000 mg | ORAL_TABLET | Freq: Four times a day (QID) | ORAL | Status: DC | PRN
  Administered 2017-07-28 – 2017-07-29 (×2): 650 mg via ORAL
  Filled 2017-07-28 (×2): qty 2

## 2017-07-28 MED ORDER — SODIUM CHLORIDE 0.9 % IV SOLN
1.0000 g | INTRAVENOUS | Status: DC
  Administered 2017-07-29: 1 g via INTRAVENOUS
  Filled 2017-07-28: qty 10

## 2017-07-28 MED ORDER — MORPHINE SULFATE (PF) 4 MG/ML IV SOLN
4.0000 mg | Freq: Once | INTRAVENOUS | Status: AC
  Administered 2017-07-28: 4 mg via INTRAVENOUS
  Filled 2017-07-28: qty 1

## 2017-07-28 MED ORDER — ONDANSETRON HCL 4 MG/2ML IJ SOLN
4.0000 mg | Freq: Four times a day (QID) | INTRAMUSCULAR | Status: DC | PRN
  Administered 2017-07-28: 4 mg via INTRAVENOUS
  Filled 2017-07-28: qty 2

## 2017-07-28 MED ORDER — SODIUM CHLORIDE 0.9 % IV SOLN: 1.0000 g | Freq: Once | INTRAVENOUS | Status: DC

## 2017-07-28 MED ORDER — ENOXAPARIN SODIUM 40 MG/0.4ML ~~LOC~~ SOLN
40.0000 mg | SUBCUTANEOUS | Status: DC
  Administered 2017-07-28: 40 mg via SUBCUTANEOUS
  Filled 2017-07-28 (×2): qty 0.4

## 2017-07-28 MED ORDER — SENNOSIDES-DOCUSATE SODIUM 8.6-50 MG PO TABS: 1.0000 | ORAL_TABLET | Freq: Every evening | ORAL | Status: DC | PRN

## 2017-07-28 MED ORDER — SODIUM CHLORIDE 0.9 % IV BOLUS
2000.0000 mL | Freq: Once | INTRAVENOUS | Status: AC
  Administered 2017-07-28: 2000 mL via INTRAVENOUS

## 2017-07-28 MED ORDER — POTASSIUM CHLORIDE 20 MEQ PO PACK
40.0000 meq | PACK | Freq: Two times a day (BID) | ORAL | Status: DC
  Administered 2017-07-28 (×2): 40 meq via ORAL
  Filled 2017-07-28 (×4): qty 2

## 2017-07-28 MED ORDER — MORPHINE SULFATE (PF) 4 MG/ML IV SOLN
2.0000 mg | INTRAVENOUS | Status: DC | PRN
  Administered 2017-07-28 – 2017-07-29 (×3): 2 mg via INTRAVENOUS
  Filled 2017-07-28 (×3): qty 1

## 2017-07-28 MED ORDER — ACETAMINOPHEN 650 MG RE SUPP: 650.0000 mg | Freq: Four times a day (QID) | RECTAL | Status: DC | PRN

## 2017-07-28 MED ORDER — POTASSIUM CHLORIDE IN NACL 40-0.9 MEQ/L-% IV SOLN
INTRAVENOUS | Status: AC
  Administered 2017-07-28 – 2017-07-29 (×2): 75 mL/h via INTRAVENOUS
  Filled 2017-07-28 (×2): qty 1000

## 2017-07-28 MED ORDER — SODIUM CHLORIDE 0.9 % IV SOLN
1.0000 g | Freq: Once | INTRAVENOUS | Status: AC
  Administered 2017-07-28: 1 g via INTRAVENOUS
  Filled 2017-07-28: qty 10

## 2017-07-28 NOTE — ED Provider Notes (Signed)
[facility]   CSN: [id number] Arrival date & time: 07/28/17  [id number]     History   Chief Complaint Chief Complaint  Patient presents with  . Flank Pain  . Emesis    HPI [name] is a 37 y.o. female.  HPI  complains of left flank pain nonradiating onset 1 week ago.  And constant.  She is treated herself with over-the-counter pain medication without relief.  She reports today that she developed 4-5 episodes of vomiting since this morning other associated symptoms include subjective fever and chills.  No other associated symptoms.  Last normal menstrual period 2 weeks ago.  Pain is worse with lying on her left side and not improved by anything.  Pain severe at present. Past Medical History:  Diagnosis Date  . Asthma     Patient Active Problem List   Diagnosis Date Noted  . Asthma, moderate persistent, poorly-controlled 07/24/2015  . Routine health maintenance 07/24/2015  . Pain, dental 12/25/2012    Past Surgical History:  Procedure Laterality Date  . abscess surgery    . TONSILLECTOMY    . TUBAL LIGATION       OB History   None      Home Medications    Prior to Admission medications   Medication Sig Start Date End Date Taking? Authorizing Provider  albuterol (PROVENTIL HFA;VENTOLIN HFA) 108 (90 Base) MCG/ACT inhaler Inhale 2 puffs into the lungs every 6 (six) hours as needed for wheezing or shortness of breath. 11/27/15   Marquette SaaLancaster, Abigail Joseph, MD  beclomethasone (QVAR) 40 MCG/ACT inhaler Inhale 2 puffs into the lungs 2 (two) times daily. 07/24/15   Marquette SaaLancaster, Abigail Joseph, MD  ibuprofen (ADVIL,MOTRIN) 200 MG tablet Take 400 mg by mouth every 6 (six) hours as needed for pain.    [provider]  methocarbamol (ROBAXIN) 500 MG tablet Take 1 tablet (500 mg total) by mouth 2 (two) times daily. 06/24/17   Dartha LodgeFord, Kelsey N, PA-C  naproxen (NAPROSYN) 500 MG tablet Take 1 tablet (500 mg total) by mouth 2  (two) times daily. 06/24/17   Dartha LodgeFord, Kelsey N, PA-C  oxyCODONE-acetaminophen (PERCOCET) 5-325 MG per tablet Take 1 tablet by mouth every 6 (six) hours as needed for pain. 12/25/12   Purvis SheffieldHarrison, Forrest, MD    Family History History reviewed. No pertinent family history.  Social History Social History   Tobacco Use  . Smoking status: Current Every Day Smoker    Packs/day: 0.50    Types: Cigarettes  . Smokeless tobacco: Never Used  Substance Use Topics  . Alcohol use: No  . Drug use: No     Allergies   Patient has no known allergies.   Review of Systems Review of Systems  Constitutional: Negative.   HENT: Negative.   Respiratory: Negative.   Cardiovascular: Negative.   Gastrointestinal: Positive for vomiting.  Genitourinary: Positive for flank pain.  Skin: Negative.   Neurological: Negative.   Psychiatric/Behavioral: Negative.   All other systems reviewed and are negative.    Physical Exam Updated Vital Signs BP (!) 135/58 (BP Location: Left Arm)   Pulse 74   Temp 98.9 F (37.2 C) (Oral)   Resp 18   LMP 07/13/2017 (Within Days)   SpO2 100%   Physical Exam  Constitutional: She appears well-developed and well-nourished. She appears distressed.  Appears uncomfortable  HENT:  Head: Normocephalic and atraumatic.  Eyes: Pupils are equal, round, and reactive to light. Conjunctivae are normal.  Neck: Neck supple. No tracheal deviation present. No thyromegaly present.  Cardiovascular: Normal rate and regular rhythm.  No murmur heard. Pulmonary/Chest: Effort normal and breath sounds normal.  Abdominal: Soft. Bowel sounds are normal. She exhibits no distension. There is no tenderness.  Genitourinary:  Genitourinary Comments: Left flank tenderness  Musculoskeletal: Normal range of motion. She exhibits no edema or tenderness.  Neurological: She is alert. Coordination normal.  Skin: Skin is warm and dry. No rash noted.  Psychiatric: She has a normal mood and affect.    Nursing note and vitals reviewed.    ED Treatments / Results  Labs (all labs ordered are listed, but only abnormal results are displayed) Labs Reviewed  URINALYSIS, ROUTINE W REFLEX MICROSCOPIC - Abnormal; Notable for the following components:      Result Value   APPearance CLOUDY (*)    Hgb urine dipstick MODERATE (*)    Bilirubin Urine MODERATE (*)    Protein, ur 100 (*)    Nitrite POSITIVE (*)    Leukocytes, UA LARGE (*)    All other components within normal limits  BASIC METABOLIC PANEL - Abnormal; Notable for the following components:   Potassium 3.0 (*)    Glucose, Bld 135 (*)    All other components within normal limits  CBC - Abnormal; Notable for the following components:   Hemoglobin 10.8 (*)    HCT 33.5 (*)    All other components within normal limits  URINALYSIS, MICROSCOPIC (REFLEX) - Abnormal; Notable for the following components:   Bacteria, UA MANY (*)    Squamous Epithelial / LPF 0-5 (*)    All other components within normal limits  I-STAT BETA HCG BLOOD, ED ([facility], [facility], [facility] ONLY) - Abnormal; Notable for the following components:   I-stat hCG, quantitative 7.3 (*)    All other components within normal limits  URINE CULTURE   Results for orders placed or performed during the hospital encounter of 07/28/17  Urinalysis, Routine w reflex microscopic- may I&O cath if menses  Result Value Ref Range   Color, Urine YELLOW YELLOW   APPearance CLOUDY (A) CLEAR   Specific Gravity, Urine 1.020 1.005 - 1.030   pH 6.0 5.0 - 8.0   Glucose, UA NEGATIVE NEGATIVE mg/dL   Hgb urine dipstick MODERATE (A) NEGATIVE   Bilirubin Urine MODERATE (A) NEGATIVE   Ketones, ur NEGATIVE NEGATIVE mg/dL   Protein, ur 161 (A) NEGATIVE mg/dL   Nitrite POSITIVE (A) NEGATIVE   Leukocytes, UA LARGE (A) NEGATIVE  Basic metabolic panel  Result Value Ref Range   Sodium 135 135 - 145 mmol/L   Potassium 3.0 (L) 3.5 - 5.1 mmol/L   Chloride 102 101 - 111 mmol/L   CO2 23 22 - 32 mmol/L    Glucose, Bld 135 (H) 65 - 99 mg/dL   BUN 8 6 - 20 mg/dL   Creatinine, Ser 0.96 0.44 - 1.00 mg/dL   Calcium 9.0 8.9 - 04.5 mg/dL   GFR calc non Af Amer >60 >60 mL/min   GFR calc Af Amer >60 >60 mL/min   Anion gap 10 5 - 15  CBC  Result Value Ref Range   WBC 10.3 4.0 - 10.5 K/uL   RBC 4.05 3.87 - 5.11 MIL/uL   Hemoglobin 10.8 (L) 12.0 - 15.0 g/dL   HCT 40.9 (L) 81.1 - 91.4 %   MCV 82.7 78.0 - 100.0 fL   MCH 26.7 26.0 - 34.0 pg   MCHC 32.2 30.0 - 36.0 g/dL  RDW 15.4 11.5 - 15.5 %   Platelets 170 150 - 400 K/uL  Urinalysis, Microscopic (reflex)  Result Value Ref Range   RBC / HPF 0-5 0 - 5 RBC/hpf   WBC, UA TOO NUMEROUS TO COUNT 0 - 5 WBC/hpf   Bacteria, UA MANY (A) NONE SEEN   Squamous Epithelial / LPF 0-5 (A) NONE SEEN   Urine-Other MICROSCOPIC EXAM PERFORMED ON UNCONCENTRATED URINE   I-Stat beta hCG blood, ED  Result Value Ref Range   I-stat hCG, quantitative 7.3 (H) <5 mIU/mL   Comment 3           No results found. EKG None  Radiology No results found.  Procedures Procedures (including critical care time)  Medications Ordered in ED Medications  sodium chloride 0.9 % bolus 2,000 mL (has no administration in time range)  morphine 4 MG/ML injection 4 mg (has no administration in time range)  cefTRIAXone (ROCEPHIN) 1 g in sodium chloride 0.9 % 100 mL IVPB (has no administration in time range)     Initial Impression / Assessment and Plan / ED Course  I have reviewed the triage vital signs and the nursing notes.  Pertinent labs & imaging results that were available during my care of the patient were reviewed by me and considered in my medical decision making (see chart for details).     12:50 PM pain improved after treatment intravenous morphine.  IV antibiotics ordered as well as intravenous normal saline bolus.  In light of systemic symptoms with subjective fever, chills, vomiting hospitalization is warranted for IV antibiotics and symptomatic treatment.  I  have consulted internal medicine service will arrange for overnight stay.  Lab work, and clinical presentation consistent with acute pyelonephritis Minimally elevated i-STAT hCG considered to be false negative.  Patient had normal menstrual period 2 weeks ago Final Clinical Impressions(s) / ED Diagnoses  Acute pyelonephritis left side Final diagnoses:  None    ED Discharge Orders    None       Doug Sou, MD 07/28/17 1301

## 2017-07-28 NOTE — ED Notes (Signed)
Patient was given a coke and a cup of ice.

## 2017-07-28 NOTE — H&P (Signed)
Date: 07/28/2017               Patient Name:  [name] MRN: [id number]  DOB: 1980/07/20 Age / Sex: 37 y.o., female   PCP: Patient, No Pcp Per         Medical Service: Internal Medicine Teaching Service         Attending Physician: Dr. Earl Lagos, MD    First Contact: Dr. Anthonette Legato Pager: [phone]  Second Contact: Dr. Mikey Bussing Pager: [phone]       After Hours (After 5p/  First Contact Pager: [phone]  weekends / holidays): Second Contact Pager: [phone]   Chief Complaint: L Flank Pain  History of Present Illness: [name] is a 37 yo F with a past medical history of asthma, tobacco use who presented to the ED with complaints of L flank pain, n/v.   She reports the onset of L sided flank pain about one week ago. The pain is described as a sharp, steady pain that has been constant and progressively worsened since onset. The pain worsens with laying on that side, no alleviating factors. She notes lower abdominal pain with urination but no overt dysuria. Also notes whitish vaginal discharge, denies vaginal itching or bleeding, LMP 2 weeks ago (around 3/17). Starting last night she developed subjective fever, chills, fatigue, and vomiting. She vomited 4-5 times which was white/foamy, no blood or coffee ground. She has been able to tolerate some fluid intake. She notes similar symptoms thirteen years ago which were less severe than current presentation and treated successfully with anti-biotics.   In the ED, T 99.6, HR 90, BP 145/79, 100% on RA.  Labs remarkable for potassium 3.0, WBC 10.3, hemoglobin 10.8.  U/A with large leukocytes, positive nitrites, many bacteria on micro, also had moderate bilirubin, moderate hemoglobin with 0-5 RBCs.  i-STAT hCG testing borderline positive at 7.3.  She received 2 L IV fluid, morphine, and started on Ceftriaxone   Meds:  No outpatient medications have been marked as taking for the 07/28/17 encounter [facility] Encounter).      Allergies: Allergies as of 07/28/2017  . (No Known Allergies)   Past Medical History:  Diagnosis Date  . Asthma     Family History: Mother- HTN Maternal Grandmother- HTN, CVA   Social History:  Social History   Tobacco Use  . Smoking status: Current Every Day Smoker    Packs/day: 0.50    Types: Cigarettes  . Smokeless tobacco: Never Used  Substance Use Topics  . Alcohol use: No  . Drug use: No   Sexually active   Review of Systems: A complete ROS was negative except as per HPI.   Physical Exam: Blood pressure 107/61, pulse 74, temperature 98.9 F (37.2 C), temperature source Oral, resp. rate 18, last menstrual period 07/13/2017, SpO2 100 %. General: Resting on stretcher, tearful on exam  Head: Normocephalic, atraumatic  Eyes: Normal conjuctiva, PERRL  ENT: Moist mucus membranes, no pharyngeal exudate, tongue piercing  CV: RRR, no murmur appreciated  Resp: Slight wheeze bilaterally, otherwise clear, normal work of breathing, no distress  Abd: Soft, +BS, tenderness to LLQ and L flank, no CVA tenderness  Extr: No LE edema, good peripheral pulses  Neuro: Alert and oriented x3, no gross focal neuro deficits  Skin: Warm, dry     Assessment & Plan by Problem:  Acute Complicated Urinary Tract Infection Patient presented with left flank pain, subjective fever, chills, vomiting and U/A found to be grossly positive with  large leukocytes, positive nitrites, many bacteria on micro.  Urine culture collected and pending.  Severity of symptoms and location of pain is consistent with a complicated UTI, potentially pyelo, rather than a simple cystitis, though she is afebrile and white count is normal.  She is received IV fluids and was started on ceftriaxone. --Monitor vital signs, fever curve --Cont Ceftriaxone --F/u Urine Cx  --Pain Control: Morphine 2 mg q4hr prn and prn senokot  --Nausea Control: Zofran 4 mg q6hr prn  --Maintenance IVF with K at 75 cc/hr --BMP, CBC in  am   Hypokalemia K 3.0 on admission labs --PO 40mEq x 2   H/o Asthma Reports history of asthma, only on albuterol currently per patient, though two years ago with family medicine was recommended to begin maintenance inhaler with Qvar which could be addressed on discharge.   Positive beta hCG Point of care serum hCG testing returned positive with borderline value. Given her LMP was within the last two weeks and her history of tubal ligation, this is likely a false positive result and given the level would be negative on a urine pregnancy test.     Dispo: Admit patient to Observation with expected length of stay less than 2 midnights.  Signed: Ginger CarneHarden, Bryley Kovacevic, MD 07/28/2017, 2:00 PM  Pager: [phone]

## 2017-07-28 NOTE — ED Triage Notes (Signed)
Pt reports left side pain X1 week. She reports vomiting and chills that started last night. Pt states she tried to take azo with no relief. Pt tearful in triage.

## 2017-07-29 LAB — BASIC METABOLIC PANEL
ANION GAP: 9 (ref 5–15)
BUN: 6 mg/dL (ref 6–20)
CHLORIDE: 107 mmol/L (ref 101–111)
CO2: 23 mmol/L (ref 22–32)
Calcium: 8.2 mg/dL — ABNORMAL LOW (ref 8.9–10.3)
Creatinine, Ser: 0.75 mg/dL (ref 0.44–1.00)
GFR calc Af Amer: >60 mL/min (ref >60)
GLUCOSE: 96 mg/dL (ref 65–99)
POTASSIUM: 4.5 mmol/L (ref 3.5–5.1)
Sodium: 139 mmol/L (ref 135–145)

## 2017-07-29 LAB — CBC
HEMATOCRIT: 29.5 % — AB (ref 36.0–46.0)
HEMOGLOBIN: 9.6 g/dL — AB (ref 12.0–15.0)
MCH: 27.3 pg (ref 26.0–34.0)
MCHC: 32.5 g/dL (ref 30.0–36.0)
MCV: 83.8 fL (ref 78.0–100.0)
Platelets: 139 10*3/uL — ABNORMAL LOW (ref 150–400)
RBC: 3.52 MIL/uL — ABNORMAL LOW (ref 3.87–5.11)
RDW: 16.3 % — AB (ref 11.5–15.5)
WBC: 11.6 10*3/uL — ABNORMAL HIGH (ref 4.0–10.5)

## 2017-07-29 LAB — HIV ANTIBODY (ROUTINE TESTING W REFLEX): HIV Screen 4th Generation wRfx: NONREACTIVE

## 2017-07-29 MED ORDER — BECLOMETHASONE DIPROPIONATE 40 MCG/ACT IN AERS: 2.0000 | INHALATION_SPRAY | Freq: Two times a day (BID) | RESPIRATORY_TRACT | 0 refills | Status: DC

## 2017-07-29 MED ORDER — CEFDINIR 300 MG PO CAPS: 300.0000 mg | ORAL_CAPSULE | Freq: Two times a day (BID) | ORAL | 0 refills | Status: AC

## 2017-07-29 NOTE — Discharge Summary (Signed)
   Subjective: No acute events overnight following admission, this morning patient reports pain has improved.  Does note residual left flank pain, no longer experiencing any lower abdominal pain with urination.  Tolerated p.o. intake overnight  Objective:  Vital signs in last 24 hours: Vitals:   07/28/17 1255 07/28/17 1844 07/28/17 2117 07/29/17 0506  BP: 107/61 (!) 99/54 96/60 120/66  Pulse:  76 60 60  Resp:  18 18 18   Temp:  98.2 F (36.8 C) 98.3 F (36.8 C) 98.7 F (37.1 C)  TempSrc:  Oral Oral Oral  SpO2:  99% 100% 96%  Weight:   165 lb (74.8 kg)    General: Resting in bed comfortably, no acute distress HEENT: Normal conjunctiva, normocephalic atraumatic CV: Regular rate and rhythm, no murmur appreciated Resp: Scattered end inspiratory wheeze, otherwise clear breath sounds, normal work of breathing, no distress  Abd: Soft, +BS, interval improvement in left flank and left lower quadrant tenderness to palpation, no suprapubic tenderness Extr: No lower extremity edema Neuro: Alert and oriented x3,  Skin: Warm, dry      Assessment/Plan:   Acute Complicated Urinary Tract Infection Present with left flank pain, subjective fever, chills, vomiting, with U/A grossly positive with large leukocytes, positive nitrites, many bacteria.  Urine culture pending.  Given severity of symptoms, location of pain is felt to be a complicated UTI such as pyelonephritis.  She has received IV ceftriaxone, IV fluids and nausea control with improvement.  She is stable for discharge with completion of 10-day antibiotic course. --Transition to oral Omnicef, plan to complete 10-day course (end date 4/11) --Follow-up urine culture to ensure susceptibility, and adjust antibiotics as needed  Hypokalemia, resolved Potassium 2.0 on admission labs, repleted and currently 4.5.  Asthma Patient has a history of asthma, only on albuterol as needed as an outpatient, though 2 years ago she was recommended to begin  maintenance inhaler with Qvar.  Encourage patient to establish with a PCP to optimize asthma control and other aspects of health.  Dispo: Anticipated discharge in approximately 0-1 day(s).   Ginger CarneHarden, Garrit Marrow, MD 07/29/2017, 11:47 AM Pager: [phone]

## 2017-07-29 NOTE — Progress Notes (Signed)
Elke R Salah to be D/C'd  per MD order. Discussed with the patient and all questions fully answered.  VSS, Skin clean, dry and intact without evidence of skin break down, no evidence of skin tears noted.  IV catheter discontinued intact. Site without signs and symptoms of complications. Dressing and pressure applied.  An After Visit Summary was printed and given to the patient. Patient received prescription.  D/c education completed with patient/family including follow up instructions, medication list, d/c activities limitations if indicated, with other d/c instructions as indicated by MD - patient able to verbalize understanding, all questions fully answered.   Patient instructed to return to ED, call 911, or call MD for any changes in condition.   Patient to be escorted via WC, and D/C home via private auto.

## 2017-07-30 NOTE — Discharge Summary (Signed)
Name: [name] BurowSummer R Becker MRN: [id number] DOB: [birth year] 37 y.o. PCP: Patient, No Pcp Per  Date of Admission: 07/28/2017  8:30 AM Date of Discharge: 07/29/2017 Attending Physician: Dr. Earl LagosNischal Narendra  Discharge Diagnosis: Active Problems:   Pyelonephritis   Discharge Medications: Allergies as of 07/29/2017   No Known Allergies     Medication List    TAKE these medications   albuterol 108 (90 Base) MCG/ACT inhaler Commonly known as:  PROVENTIL HFA;VENTOLIN HFA Inhale 2 puffs into the lungs every 6 (six) hours as needed for wheezing or shortness of breath.   beclomethasone 40 MCG/ACT inhaler Commonly known as:  QVAR Inhale 2 puffs into the lungs 2 (two) times daily.   cefdinir 300 MG capsule Commonly known as:  OMNICEF Take 1 capsule (300 mg total) by mouth 2 (two) times daily for 9 days.   ibuprofen 200 MG tablet Commonly known as:  ADVIL,MOTRIN Take 400 mg by mouth every 6 (six) hours as needed for pain.       Disposition and follow-up:   KrystalKrystal Becker [name] [facility].  At the hospital follow up visit please address:  1.  -Ensure completion of anti-biotic course (end date 4/11) and resolution of sx -Assess asthma sx and if she was able to fill previously recommended qvar which was refilled on discharge   2.  Labs / imaging needed at time of follow-up: None  3.  Pending labs/ test needing follow-up: Urine Culture susceptibilities   Follow-up Appointments:   Hospital Course by problem list:   Acute Complicated Urinary Tract Infection d/t E Coli She presented with a left flank pain, subjective fever, chills, vomiting with U/A grossly positive with large leukocytes, positive nitrites, many bacteria.  Urine culture returned with greater than 100,000 colony-forming units of E. coli.  Given severity of symptoms and location of pain, UTI was felt to be complicated and likely pyelonephritis though she was afebrile,  no white count.  She was started on IV ceftriaxone and received IV fluids and antiemetic and pain control.  She quickly improved and was transitioned to oral Cefdinir to complete a 10-day course ending 4/11.  Urine culture susceptibilities will be followed up on to ensure appropriate antibiotic selection.  Asthma She has a history of asthma and was only on albuterol as needed, though prior outpatient notes several years ago recommend Qvar as maintenance therapy.  This was represcribed upon discharge and she was encouraged to establish with a PCP.  Positive beta hCG Point-of-care serum hCG testing returned positive with borderline value.  On discussion with lab and given her last menstrual period was within the last 2 weeks and she has a history of tubal ligation, this was likely a false positive result and if she were tested with urine testing result would have been negative.  Could consider repeat testing if next menstrual period is delayed.    Discharge Vitals:   BP 104/74 (BP Location: Right Arm)   Pulse (!) 59   Temp 100.2 F (37.9 C) (Oral)   Resp 18   Wt 165 lb (74.8 kg)   LMP 07/13/2017 (Within Days)   SpO2 99%   BMI 26.63 kg/m   Pertinent Labs, Studies, and Procedures:  BMP Latest Ref Rng & Units 07/29/2017 07/28/2017 08/15/2008  Glucose 65 - 99 mg/dL 96 409(W135(H) 119(J101(H)  BUN 6 - 20 mg/dL 6 8 7   Creatinine 0.44 - 1.00 mg/dL 4.780.75 2.950.87 6.210.60  Sodium 135 -  145 mmol/L 139 135 139  Potassium 3.5 - 5.1 mmol/L 4.5 3.0(L) 3.7  Chloride 101 - 111 mmol/L 107 102 104  CO2 22 - 32 mmol/L 23 23 30   Calcium 8.9 - 10.3 mg/dL 8.2(L) 9.0 9.1   Urine Culture: >100k CFUs E Coli, susceptibilities to follow   Discharge Instructions: Discharge Instructions    Diet - low sodium heart healthy   Complete by:  As directed    Discharge instructions   Complete by:  As directed    Nice to meet you [name]. - We prescribed a course of an antibiotic called Cefdinir that you will take twice a day until  April 11.  If anything needs to be changed based on the urine culture results we will give you a call. - The antibiotic and treating the infection will improve the pain, for any residual pain as you continue to recover you can use ibuprofen or Tylenol. - It will be important to reestablish with a primary care doctor.  We re-sent a prescription for Qvar until you can set up a doctor's appointment   Increase activity slowly   Complete by:  As directed       Signed: Ginger Carne, MD 07/30/2017, 3:43 PM   Pager: [phone]

## 2017-07-31 LAB — URINE CULTURE
Culture: >=100000 — AB
SPECIAL REQUESTS: NORMAL

## 2017-11-04 ENCOUNTER — Ambulatory Visit (INDEPENDENT_AMBULATORY_CARE_PROVIDER_SITE_OTHER): Payer: Self-pay | Admitting: Orthopaedic Surgery

## 2018-02-27 ENCOUNTER — Emergency Department (HOSPITAL_COMMUNITY)
Admission: EM | Admit: 2018-02-27 | Discharge: 2018-02-27 | Disposition: A | Discharge status: Home / Self Care | Payer: Medicaid Other | Source: Home / Self Care | Attending: Emergency Medicine | Admitting: Emergency Medicine

## 2018-02-27 ENCOUNTER — Other Ambulatory Visit: Payer: Self-pay

## 2018-02-27 ENCOUNTER — Encounter (HOSPITAL_COMMUNITY): Payer: Self-pay | Admitting: Pharmacy Technician

## 2018-02-27 DIAGNOSIS — Z79899 Other long term (current) drug therapy: Secondary | ICD-10-CM

## 2018-02-27 DIAGNOSIS — R55 Syncope and collapse: Secondary | ICD-10-CM | POA: Insufficient documentation

## 2018-02-27 DIAGNOSIS — J4540 Moderate persistent asthma, uncomplicated: Secondary | ICD-10-CM

## 2018-02-27 DIAGNOSIS — F17210 Nicotine dependence, cigarettes, uncomplicated: Secondary | ICD-10-CM | POA: Insufficient documentation

## 2018-02-27 LAB — CBC
HEMATOCRIT: 40.4 % (ref 36.0–46.0)
HEMOGLOBIN: 12.7 g/dL (ref 12.0–15.0)
MCH: 27.5 pg (ref 26.0–34.0)
MCHC: 31.4 g/dL (ref 30.0–36.0)
MCV: 87.4 fL (ref 80.0–100.0)
NRBC: 0 % (ref 0.0–0.2)
Platelets: 248 10*3/uL (ref 150–400)
RBC: 4.62 MIL/uL (ref 3.87–5.11)
RDW: 14.8 % (ref 11.5–15.5)
WBC: 8.2 10*3/uL (ref 4.0–10.5)

## 2018-02-27 LAB — BASIC METABOLIC PANEL
ANION GAP: 8 (ref 5–15)
BUN: 7 mg/dL (ref 6–20)
CHLORIDE: 106 mmol/L (ref 98–111)
CO2: 24 mmol/L (ref 22–32)
Calcium: 9.3 mg/dL (ref 8.9–10.3)
Creatinine, Ser: 0.92 mg/dL (ref 0.44–1.00)
Glucose, Bld: 106 mg/dL — ABNORMAL HIGH (ref 70–99)
POTASSIUM: 3.7 mmol/L (ref 3.5–5.1)
Sodium: 138 mmol/L (ref 135–145)

## 2018-02-27 LAB — I-STAT BETA HCG BLOOD, ED (MC, WL, AP ONLY): I-stat hCG, quantitative: <5 m[IU]/mL (ref <5)

## 2018-02-27 LAB — CBG MONITORING, ED: Glucose-Capillary: 98 mg/dL (ref 70–99)

## 2018-02-27 LAB — RAPID HIV SCREEN (HIV 1/2 AB+AG)
HIV 1/2 ANTIBODIES: NONREACTIVE
HIV-1 P24 ANTIGEN - HIV24: NONREACTIVE

## 2018-02-27 LAB — MAGNESIUM: Magnesium: 2 mg/dL (ref 1.7–2.4)

## 2018-02-27 MED ORDER — PROCHLORPERAZINE EDISYLATE 10 MG/2ML IJ SOLN
10.0000 mg | Freq: Once | INTRAMUSCULAR | Status: AC
  Administered 2018-02-27: 10 mg via INTRAVENOUS
  Filled 2018-02-27: qty 2

## 2018-02-27 MED ORDER — SODIUM CHLORIDE 0.9 % IV BOLUS
1000.0000 mL | Freq: Once | INTRAVENOUS | Status: AC
  Administered 2018-02-27: 1000 mL via INTRAVENOUS

## 2018-02-27 MED ORDER — DIPHENHYDRAMINE HCL 50 MG/ML IJ SOLN
25.0000 mg | Freq: Once | INTRAMUSCULAR | Status: AC
  Administered 2018-02-27: 25 mg via INTRAVENOUS
  Filled 2018-02-27: qty 1
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
## 2018-02-27 NOTE — Discharge Instructions (Addendum)
I have provided a referral to Neurology, please schedule an appointment to further evaluate your seizure like episode. If you experience any fever, chest pain, shortness of breath or worsening symptoms please return to the ED for reevaluation.

## 2018-02-27 NOTE — ED Provider Notes (Signed)
Care assumed from previous provider PA Soto. Please see note for further details. Case discussed, plan agreed upon. Will follow up on pending magnesium level. If wdl, can discharge home. If low, will supplement prior to discharge.   Mag level wdl.  Spoke with patient and mother at bedside about lab results, follow-up care and return precautions.  All questions answered.   Ward, Chase Picket, PA-C 02/27/18 1741    Virgina Norfolk, DO 02/28/18 1610

## 2018-02-27 NOTE — ED Notes (Signed)
Family at bedside. 
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
## 2018-02-27 NOTE — ED Notes (Signed)
PA states they are aware of trend of low BPs. MAPS >65.

## 2018-02-28 ENCOUNTER — Emergency Department (HOSPITAL_COMMUNITY): Payer: Medicaid Other

## 2018-02-28 ENCOUNTER — Encounter (HOSPITAL_COMMUNITY): Payer: Self-pay

## 2018-02-28 ENCOUNTER — Inpatient Hospital Stay (HOSPITAL_COMMUNITY)
Admission: EM | Admit: 2018-02-28 | Discharge: 2018-03-03 | DRG: 100 | Disposition: A | Discharge status: Home / Self Care | Payer: Medicaid Other | Attending: Internal Medicine | Admitting: Internal Medicine

## 2018-02-28 DIAGNOSIS — Z79899 Other long term (current) drug therapy: Secondary | ICD-10-CM

## 2018-02-28 DIAGNOSIS — J4540 Moderate persistent asthma, uncomplicated: Secondary | ICD-10-CM | POA: Diagnosis present

## 2018-02-28 DIAGNOSIS — Z87440 Personal history of urinary (tract) infections: Secondary | ICD-10-CM | POA: Diagnosis not present

## 2018-02-28 DIAGNOSIS — E872 Acidosis: Secondary | ICD-10-CM | POA: Diagnosis present

## 2018-02-28 DIAGNOSIS — R740 Nonspecific elevation of levels of transaminase and lactic acid dehydrogenase [LDH]: Secondary | ICD-10-CM | POA: Diagnosis present

## 2018-02-28 DIAGNOSIS — J9601 Acute respiratory failure with hypoxia: Secondary | ICD-10-CM | POA: Diagnosis present

## 2018-02-28 DIAGNOSIS — D72829 Elevated white blood cell count, unspecified: Secondary | ICD-10-CM | POA: Diagnosis present

## 2018-02-28 DIAGNOSIS — R569 Unspecified convulsions: Secondary | ICD-10-CM

## 2018-02-28 DIAGNOSIS — Z9114 Patient's other noncompliance with medication regimen: Secondary | ICD-10-CM | POA: Diagnosis not present

## 2018-02-28 DIAGNOSIS — Z9089 Acquired absence of other organs: Secondary | ICD-10-CM

## 2018-02-28 DIAGNOSIS — F1120 Opioid dependence, uncomplicated: Secondary | ICD-10-CM | POA: Diagnosis present

## 2018-02-28 DIAGNOSIS — Z9851 Tubal ligation status: Secondary | ICD-10-CM

## 2018-02-28 DIAGNOSIS — Z7951 Long term (current) use of inhaled steroids: Secondary | ICD-10-CM | POA: Diagnosis not present

## 2018-02-28 DIAGNOSIS — R808 Other proteinuria: Secondary | ICD-10-CM | POA: Diagnosis present

## 2018-02-28 DIAGNOSIS — F17210 Nicotine dependence, cigarettes, uncomplicated: Secondary | ICD-10-CM | POA: Diagnosis present

## 2018-02-28 DIAGNOSIS — I4581 Long QT syndrome: Secondary | ICD-10-CM | POA: Diagnosis present

## 2018-02-28 DIAGNOSIS — R55 Syncope and collapse: Secondary | ICD-10-CM | POA: Diagnosis present

## 2018-02-28 DIAGNOSIS — G40901 Epilepsy, unspecified, not intractable, with status epilepticus: Principal | ICD-10-CM | POA: Diagnosis present

## 2018-02-28 DIAGNOSIS — G9340 Encephalopathy, unspecified: Secondary | ICD-10-CM | POA: Diagnosis not present

## 2018-02-28 DIAGNOSIS — M549 Dorsalgia, unspecified: Secondary | ICD-10-CM | POA: Diagnosis not present

## 2018-02-28 HISTORY — DX: Unspecified convulsions: R56.9

## 2018-02-28 LAB — COMPREHENSIVE METABOLIC PANEL
ALK PHOS: 64 U/L (ref 38–126)
ALT: 114 U/L — AB (ref 0–44)
ANION GAP: 21 — AB (ref 5–15)
AST: 169 U/L — ABNORMAL HIGH (ref 15–41)
Albumin: 4.1 g/dL (ref 3.5–5.0)
BUN: 8 mg/dL (ref 6–20)
CALCIUM: 9.1 mg/dL (ref 8.9–10.3)
CO2: 13 mmol/L — ABNORMAL LOW (ref 22–32)
CREATININE: 1.15 mg/dL — AB (ref 0.44–1.00)
Chloride: 103 mmol/L (ref 98–111)
GFR calc Af Amer: >60 mL/min (ref >60)
Glucose, Bld: 230 mg/dL — ABNORMAL HIGH (ref 70–99)
Potassium: 3.1 mmol/L — ABNORMAL LOW (ref 3.5–5.1)
Sodium: 137 mmol/L (ref 135–145)
TOTAL PROTEIN: 7.2 g/dL (ref 6.5–8.1)
Total Bilirubin: 0.4 mg/dL (ref 0.3–1.2)

## 2018-02-28 LAB — I-STAT CG4 LACTIC ACID, ED: Lactic Acid, Venous: 14.77 mmol/L (ref 0.5–1.9)

## 2018-02-28 LAB — HEPATITIS C ANTIBODY

## 2018-02-28 LAB — I-STAT CHEM 8, ED
BUN: 7 mg/dL (ref 6–20)
CALCIUM ION: 1.14 mmol/L — AB (ref 1.15–1.40)
Chloride: 108 mmol/L (ref 98–111)
Creatinine, Ser: 0.9 mg/dL (ref 0.44–1.00)
GLUCOSE: 216 mg/dL — AB (ref 70–99)
HCT: 40 % (ref 36.0–46.0)
HEMOGLOBIN: 13.6 g/dL (ref 12.0–15.0)
POTASSIUM: 3.2 mmol/L — AB (ref 3.5–5.1)
SODIUM: 142 mmol/L (ref 135–145)
TCO2: 14 mmol/L — ABNORMAL LOW (ref 22–32)

## 2018-02-28 LAB — MAGNESIUM: MAGNESIUM: 2.1 mg/dL (ref 1.7–2.4)

## 2018-02-28 LAB — I-STAT BETA HCG BLOOD, ED (MC, WL, AP ONLY): I-stat hCG, quantitative: <5 m[IU]/mL (ref <5)

## 2018-02-28 LAB — HEPATITIS B SURFACE ANTIGEN: HEP B S AG: NEGATIVE

## 2018-02-28 MED ORDER — LEVETIRACETAM IN NACL 1000 MG/100ML IV SOLN
1000.0000 mg | Freq: Once | INTRAVENOUS | Status: AC
  Administered 2018-02-28: 1000 mg via INTRAVENOUS

## 2018-02-28 MED ORDER — LORAZEPAM 2 MG/ML IJ SOLN
2.0000 mg | Freq: Once | INTRAMUSCULAR | Status: AC
  Administered 2018-02-28: 2 mg via INTRAVENOUS

## 2018-02-28 MED ORDER — LEVETIRACETAM IN NACL 500 MG/100ML IV SOLN
500.0000 mg | Freq: Two times a day (BID) | INTRAVENOUS | Status: DC
  Administered 2018-03-01: 500 mg via INTRAVENOUS
  Filled 2018-02-28: qty 100

## 2018-02-28 MED ORDER — LORAZEPAM 2 MG/ML IJ SOLN
INTRAMUSCULAR | Status: AC
  Filled 2018-02-28: qty 1

## 2018-02-28 MED ORDER — SODIUM CHLORIDE 0.9 % IV BOLUS
1000.0000 mL | Freq: Once | INTRAVENOUS | Status: AC
  Administered 2018-02-28: 1000 mL via INTRAVENOUS

## 2018-02-28 MED ORDER — PROPOFOL 1000 MG/100ML IV EMUL
INTRAVENOUS | Status: AC
  Filled 2018-02-28: qty 100

## 2018-02-28 MED ORDER — LORAZEPAM 2 MG/ML IJ SOLN
INTRAMUSCULAR | Status: AC
  Filled 2018-02-28: qty 2

## 2018-02-28 MED ORDER — ETOMIDATE 2 MG/ML IV SOLN
INTRAVENOUS | Status: AC | PRN
  Administered 2018-02-28: 20 mg via INTRAVENOUS

## 2018-02-28 MED ORDER — PROPOFOL 1000 MG/100ML IV EMUL
5.0000 ug/kg/min | Freq: Once | INTRAVENOUS | Status: DC
  Administered 2018-02-28: 10 ug/kg/min via INTRAVENOUS

## 2018-02-28 MED ORDER — HEPARIN SODIUM (PORCINE) 5000 UNIT/ML IJ SOLN
5000.0000 [IU] | Freq: Three times a day (TID) | INTRAMUSCULAR | Status: DC
  Administered 2018-03-01 – 2018-03-03 (×6): 5000 [IU] via SUBCUTANEOUS
  Filled 2018-02-28 (×6): qty 1

## 2018-02-28 MED ORDER — PHENYTOIN SODIUM 50 MG/ML IJ SOLN
100.0000 mg | Freq: Three times a day (TID) | INTRAMUSCULAR | Status: DC
  Administered 2018-03-01: 100 mg via INTRAVENOUS
  Filled 2018-02-28: qty 2

## 2018-02-28 MED ORDER — SODIUM CHLORIDE 0.9 % IV SOLN
1500.0000 mg | Freq: Once | INTRAVENOUS | Status: AC
  Administered 2018-02-28: 1500 mg via INTRAVENOUS
  Filled 2018-02-28: qty 30

## 2018-02-28 MED ORDER — LORAZEPAM 2 MG/ML IJ SOLN
1.0000 mg | Freq: Once | INTRAMUSCULAR | Status: AC
  Administered 2018-02-28: 1 mg via INTRAVENOUS

## 2018-02-28 MED ORDER — LORAZEPAM 2 MG/ML IJ SOLN
2.0000 mg | Freq: Once | INTRAMUSCULAR | Status: AC
  Administered 2018-02-28: 3 mg via INTRAVENOUS

## 2018-02-28 MED ORDER — INSULIN ASPART 100 UNIT/ML ~~LOC~~ SOLN
2.0000 [IU] | SUBCUTANEOUS | Status: DC
  Administered 2018-03-01: 2 [IU] via SUBCUTANEOUS

## 2018-02-28 MED ORDER — PANTOPRAZOLE SODIUM 40 MG PO PACK
40.0000 mg | PACK | Freq: Every day | ORAL | Status: DC
  Administered 2018-03-01 – 2018-03-02 (×2): 40 mg
  Filled 2018-02-28 (×2): qty 20

## 2018-02-28 MED ORDER — ROCURONIUM BROMIDE 50 MG/5ML IV SOLN
INTRAVENOUS | Status: AC | PRN
  Administered 2018-02-28: 75 mg via INTRAVENOUS

## 2018-02-28 NOTE — ED Notes (Signed)
Pt continues to have grand mal seizure

## 2018-02-28 NOTE — ED Provider Notes (Addendum)
Shared service with APP on 02/27/2018.    I have personally seen and examined the patient, providing direct face to face care.  Physical exam findings and plan include 5+ strength in UE and LE with f/e at major joints. Sensation to palpation intact in UE and LE. CNs 2-12 grossly intact.  EOMFI.  PERRL.   Finger nose and coordination intact bilateral.   Visual fields intact to finger testing. No nystagmus.  Pt denies illegal drugs.  First time seizure vs syncope, normal neuro exam, back to baseline. Discussed no driving/ machinery, fup with neuro (seizure like activity) and cardiology (QT)  Seizure like activity    Blane Ohara, MD 02/28/18 2317    Blane Ohara, MD 02/28/18 2317

## 2018-02-28 NOTE — Progress Notes (Signed)
RT assisted ER MD with pt intubation. Pt intubated with a 7.5 ETT, 23 @ lip, placed commercial tube holder, verified tube placement, Bilat BS, chest rise, and ETCO2 detector. Pt placed on vent on PRVC, VT 490, rate 15, 40% FIO2, PEEP 5. Pt tolerating vent settings and ETT. RT will continue to monitor.
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
## 2018-02-28 NOTE — ED Notes (Signed)
Patient transported to MRI 

## 2018-02-28 NOTE — ED Notes (Signed)
Dr Jodi Mourning informed of lactic acid results 14.77
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
## 2018-02-28 NOTE — Progress Notes (Addendum)
RT transported pt to CT and MRI and  back without any complications. RT will continue to monitor.
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
## 2018-02-28 NOTE — ED Notes (Signed)
Pt combative
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
## 2018-03-01 ENCOUNTER — Inpatient Hospital Stay (HOSPITAL_COMMUNITY): Payer: Medicaid Other

## 2018-03-01 DIAGNOSIS — J9601 Acute respiratory failure with hypoxia: Secondary | ICD-10-CM

## 2018-03-01 DIAGNOSIS — D72829 Elevated white blood cell count, unspecified: Secondary | ICD-10-CM | POA: Diagnosis present

## 2018-03-01 DIAGNOSIS — G40901 Epilepsy, unspecified, not intractable, with status epilepticus: Principal | ICD-10-CM

## 2018-03-01 DIAGNOSIS — G9340 Encephalopathy, unspecified: Secondary | ICD-10-CM

## 2018-03-01 LAB — LACTIC ACID, PLASMA
Lactic Acid, Venous: 2.3 mmol/L (ref 0.5–1.9)
Lactic Acid, Venous: 1.9 mmol/L (ref 0.5–1.9)

## 2018-03-01 LAB — CSF CELL COUNT WITH DIFFERENTIAL
RBC Count, CSF: 0 /mm3
Tube #: 3
WBC CSF: 0 /mm3 (ref 0–5)

## 2018-03-01 LAB — I-STAT ARTERIAL BLOOD GAS, ED
Acid-base deficit: 8 mmol/L — ABNORMAL HIGH (ref 0.0–2.0)
Bicarbonate: 19.1 mmol/L — ABNORMAL LOW (ref 20.0–28.0)
O2 Saturation: 98 %
Patient temperature: 96.5
TCO2: 20 mmol/L — AB (ref 22–32)
pCO2 arterial: 40.7 mmHg (ref 32.0–48.0)
pH, Arterial: 7.273 — ABNORMAL LOW (ref 7.350–7.450)
pO2, Arterial: 122 mmHg — ABNORMAL HIGH (ref 83.0–108.0)

## 2018-03-01 LAB — MRSA PCR SCREENING: MRSA BY PCR: NEGATIVE

## 2018-03-01 LAB — GLUCOSE, CAPILLARY
GLUCOSE-CAPILLARY: 117 mg/dL — AB (ref 70–99)
GLUCOSE-CAPILLARY: 114 mg/dL — AB (ref 70–99)
GLUCOSE-CAPILLARY: 74 mg/dL (ref 70–99)
Glucose-Capillary: 124 mg/dL — ABNORMAL HIGH (ref 70–99)
Glucose-Capillary: 85 mg/dL (ref 70–99)

## 2018-03-01 LAB — POCT I-STAT 3, ART BLOOD GAS (G3+)
Acid-base deficit: 4 mmol/L — ABNORMAL HIGH (ref 0.0–2.0)
Bicarbonate: 19.8 mmol/L — ABNORMAL LOW (ref 20.0–28.0)
O2 Saturation: 100 %
PH ART: 7.389 (ref 7.350–7.450)
TCO2: 21 mmol/L — AB (ref 22–32)
pCO2 arterial: 32.7 mmHg (ref 32.0–48.0)
pO2, Arterial: 180 mmHg — ABNORMAL HIGH (ref 83.0–108.0)

## 2018-03-01 LAB — RAPID URINE DRUG SCREEN, HOSP PERFORMED
AMPHETAMINES: NOT DETECTED
Amphetamines: NOT DETECTED
Barbiturates: NOT DETECTED
Barbiturates: NOT DETECTED
Benzodiazepines: POSITIVE — AB
Benzodiazepines: POSITIVE — AB
COCAINE: NOT DETECTED
COCAINE: NOT DETECTED
OPIATES: NOT DETECTED
Opiates: NOT DETECTED
Tetrahydrocannabinol: NOT DETECTED
Tetrahydrocannabinol: NOT DETECTED

## 2018-03-01 LAB — COMPREHENSIVE METABOLIC PANEL
ALBUMIN: 3.6 g/dL (ref 3.5–5.0)
ALT: 130 U/L — ABNORMAL HIGH (ref 0–44)
AST: 199 U/L — AB (ref 15–41)
Alkaline Phosphatase: 58 U/L (ref 38–126)
Anion gap: 9 (ref 5–15)
BUN: 7 mg/dL (ref 6–20)
CHLORIDE: 107 mmol/L (ref 98–111)
CO2: 21 mmol/L — AB (ref 22–32)
Calcium: 7.8 mg/dL — ABNORMAL LOW (ref 8.9–10.3)
Creatinine, Ser: 0.97 mg/dL (ref 0.44–1.00)
GFR calc Af Amer: >60 mL/min (ref >60)
GFR calc non Af Amer: >60 mL/min (ref >60)
GLUCOSE: 154 mg/dL — AB (ref 70–99)
POTASSIUM: 3.7 mmol/L (ref 3.5–5.1)
Sodium: 137 mmol/L (ref 135–145)
Total Bilirubin: 0.7 mg/dL (ref 0.3–1.2)
Total Protein: 6 g/dL — ABNORMAL LOW (ref 6.5–8.1)

## 2018-03-01 LAB — CBC WITH DIFFERENTIAL/PLATELET
ABS IMMATURE GRANULOCYTES: 0.18 10*3/uL — AB (ref 0.00–0.07)
BASOS ABS: 0.1 10*3/uL (ref 0.0–0.1)
Basophils Relative: 0 %
EOS PCT: 0 %
Eosinophils Absolute: 0 10*3/uL (ref 0.0–0.5)
HCT: 34.3 % — ABNORMAL LOW (ref 36.0–46.0)
HEMOGLOBIN: 10.6 g/dL — AB (ref 12.0–15.0)
Immature Granulocytes: 1 %
LYMPHS PCT: 6 %
Lymphs Abs: 1.4 10*3/uL (ref 0.7–4.0)
MCH: 27.7 pg (ref 26.0–34.0)
MCHC: 30.9 g/dL (ref 30.0–36.0)
MCV: 89.8 fL (ref 80.0–100.0)
Monocytes Absolute: 0.8 10*3/uL (ref 0.1–1.0)
Monocytes Relative: 4 %
NEUTROS ABS: 20.7 10*3/uL — AB (ref 1.7–7.7)
Neutrophils Relative %: 89 %
Platelets: 219 10*3/uL (ref 150–400)
RBC: 3.82 MIL/uL — ABNORMAL LOW (ref 3.87–5.11)
RDW: 14.6 % (ref 11.5–15.5)
WBC: 23.1 10*3/uL — ABNORMAL HIGH (ref 4.0–10.5)
nRBC: 0 % (ref 0.0–0.2)

## 2018-03-01 LAB — URINALYSIS, ROUTINE W REFLEX MICROSCOPIC
BILIRUBIN URINE: NEGATIVE
GLUCOSE, UA: NEGATIVE mg/dL
KETONES UR: NEGATIVE mg/dL
Nitrite: NEGATIVE
PH: 5 (ref 5.0–8.0)
Protein, ur: 100 mg/dL — AB
Specific Gravity, Urine: 1.024 (ref 1.005–1.030)

## 2018-03-01 LAB — ACETAMINOPHEN LEVEL

## 2018-03-01 LAB — PROTEIN AND GLUCOSE, CSF
GLUCOSE CSF: 83 mg/dL — AB (ref 40–70)
Total  Protein, CSF: 31 mg/dL (ref 15–45)

## 2018-03-01 LAB — PROCALCITONIN: PROCALCITONIN: 0.17 ng/mL

## 2018-03-01 LAB — TSH: TSH: 1.38 u[IU]/mL (ref 0.350–4.500)

## 2018-03-01 LAB — SALICYLATE LEVEL

## 2018-03-01 LAB — TRIGLYCERIDES: Triglycerides: 41 mg/dL (ref <150)

## 2018-03-01 LAB — ETHANOL: Alcohol, Ethyl (B): <10 mg/dL (ref <10)

## 2018-03-01 MED ORDER — SODIUM CHLORIDE 0.9 % IV SOLN
INTRAVENOUS | Status: DC
  Administered 2018-03-01: 02:00:00 via INTRAVENOUS

## 2018-03-01 MED ORDER — MIDAZOLAM HCL 2 MG/2ML IJ SOLN
INTRAMUSCULAR | Status: AC
  Filled 2018-03-01: qty 2

## 2018-03-01 MED ORDER — MIDAZOLAM HCL 2 MG/2ML IJ SOLN
2.0000 mg | Freq: Once | INTRAMUSCULAR | Status: AC
  Administered 2018-03-01: 2 mg via INTRAVENOUS

## 2018-03-01 MED ORDER — FENTANYL CITRATE (PF) 100 MCG/2ML IJ SOLN: 100.0000 ug | INTRAMUSCULAR | Status: DC | PRN

## 2018-03-01 MED ORDER — FENTANYL CITRATE (PF) 100 MCG/2ML IJ SOLN: 50.0000 ug | INTRAMUSCULAR | Status: DC | PRN

## 2018-03-01 MED ORDER — PROPOFOL 1000 MG/100ML IV EMUL
0.0000 ug/kg/min | INTRAVENOUS | Status: DC
  Administered 2018-03-01: 25 ug/kg/min via INTRAVENOUS
  Filled 2018-03-01 (×2): qty 100

## 2018-03-01 MED ORDER — DEXTROSE-NACL 5-0.45 % IV SOLN
INTRAVENOUS | Status: DC
  Administered 2018-03-01 – 2018-03-02 (×2): via INTRAVENOUS

## 2018-03-01 MED ORDER — POTASSIUM CHLORIDE 20 MEQ/15ML (10%) PO SOLN
40.0000 meq | Freq: Once | ORAL | Status: AC
  Administered 2018-03-01: 40 meq
  Filled 2018-03-01: qty 30

## 2018-03-01 MED ORDER — ORAL CARE MOUTH RINSE
15.0000 mL | OROMUCOSAL | Status: DC
  Administered 2018-03-01 (×6): 15 mL via OROMUCOSAL

## 2018-03-01 MED ORDER — ACETAMINOPHEN 325 MG PO TABS
650.0000 mg | ORAL_TABLET | Freq: Once | ORAL | Status: AC
  Administered 2018-03-01: 650 mg via ORAL
  Filled 2018-03-01: qty 2

## 2018-03-01 MED ORDER — SODIUM CHLORIDE 0.9 % IV BOLUS
500.0000 mL | Freq: Once | INTRAVENOUS | Status: AC
  Administered 2018-03-01: 500 mL via INTRAVENOUS

## 2018-03-01 MED ORDER — CHLORHEXIDINE GLUCONATE 0.12% ORAL RINSE (MEDLINE KIT)
15.0000 mL | Freq: Two times a day (BID) | OROMUCOSAL | Status: DC
  Administered 2018-03-01 – 2018-03-02 (×3): 15 mL via OROMUCOSAL

## 2018-03-01 MED ORDER — LEVETIRACETAM IN NACL 1000 MG/100ML IV SOLN
1000.0000 mg | Freq: Two times a day (BID) | INTRAVENOUS | Status: DC
  Administered 2018-03-01 – 2018-03-03 (×4): 1000 mg via INTRAVENOUS
  Filled 2018-03-01 (×4): qty 100

## 2018-03-01 MED ORDER — METHADONE HCL 10 MG PO TABS
132.0000 mg | ORAL_TABLET | Freq: Every day | ORAL | Status: DC
  Administered 2018-03-01 – 2018-03-02 (×2): 130 mg via NASOGASTRIC
  Filled 2018-03-01 (×2): qty 13
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
## 2018-03-01 NOTE — Progress Notes (Signed)
Per referral from last night's chaplain, checked in on family.  Daughter and sister are present, appeared composed with underlying stressed. Daughter became slightly teary when asked how they are doing, but declined support.    Please call as needed or requested.  Theodoro Parma [phone]    03/01/18 1000  Clinical Encounter Type  Visited With Family  Visit Type Follow-up  Referral From Chaplain  Consult/Referral To Chaplain  Stress Factors  Patient Stress Factors Health changes  Family Stress Factors Lack of knowledge

## 2018-03-01 NOTE — Progress Notes (Signed)
EEG reviewed. Diffuse slowing with no electrographic seizure seen.   On propofol at 10 mcg/kg/min. Not clinically seizing.   A/R: Status epilepticus, now resolved on propofol after Keppra and fosphenytoin loading doses.  1. Continue propofol for now 2. Continue scheduled Keppra and Dilantin 3. In AM, would attempt to wean propofol. Will discuss with Dr. Amada Jupiter in the morning whether to assess clinically after propofol weaning, or whether she should have LTM EEG in place during wean.   Electronically signed: Dr. Caryl Pina

## 2018-03-01 NOTE — Progress Notes (Signed)
RT obtained blood gas from pt right radial, ABG results  Ph 7.27 PCO2 40.7 HCO3 19.1 PaO2 122  RT will continue to monitor.

## 2018-03-01 NOTE — Procedures (Addendum)
Indication: seizure/AMS  Risks of the procedure were dicussed with the patient including post-LP headache, bleeding, infection, weakness/numbness of legs(radiculopathy), death.  The patient/patient's proxy agreed and written consent was obtained.   The patient was prepped and draped, and using sterile technique a 20 gauge quinke spinal needle was inserted in the L4-L5 space not CSF was obtained. Moved to L3-L4 and on the second pass clear CSF. The opening pressure was 22 cm, but patient was being ventilated and was tightly curled and therefore this borderline elevation is unlikely to be of significance.   Approximately 5 cc of clear CSF were obtained and sent for analysis.   Valentina Lucks, MSN, NP-C [facility]  I was present for the entire procedure.  Ritta Slot, MD [facility] [phone]  If 7pm- 7am, please page neurology on call as listed in AMION.

## 2018-03-01 NOTE — Progress Notes (Signed)
Flutter and IS at bedside, patient is not alert enough to do either.

## 2018-03-01 NOTE — ED Notes (Signed)
Pt dentures, clothing and jewelry sent home with mother

## 2018-03-01 NOTE — Progress Notes (Signed)
RT transported pt to 4N16 without complications pt stable throughout transport. Called report to receiving RT Homero Fellers. RT will continue to monitor.

## 2018-03-01 NOTE — Procedures (Signed)
History: 37 year old female undergoing stat EEG to rule out status epilepticus  Sedation: Propofol  Technique: This is a 21 channel stat scalp EEG performed at the bedside with bipolar and monopolar montages arranged in accordance to the international 10/20 system of electrode placement. One channel was dedicated to EKG recording.    Background: The background is consistent with a sedated state including bifrontally predominant generalized irregular delta and theta activities with occasional sleep spindles and vertex waves.  Photic stimulation: Physiologic driving is not performed  EEG Abnormalities: Sedated state only  Clinical Interpretation: This EEG is consistent with the reported sedated state. No waking period was recorded and therefore encephalopathy cannot be assessed.   There was no seizure or seizure predisposition recorded on this study. Please note that a normal EEG does not preclude the possibility of epilepsy.   Ritta Slot, MD [facility] [phone]  If 7pm- 7am, please page neurology on call as listed in AMION.

## 2018-03-01 NOTE — Progress Notes (Signed)
Subjective: As seen the patient reviewed the above note.  Exam: Vitals:   03/01/18 0800 03/01/18 1019  BP: 116/86   Pulse: (!) 59   Resp: 20   Temp: 100.1 F (37.8 C)   SpO2: 100% 100%   Gen: In bed, intubated Resp: Ventilated Abd: soft, nt  Neuro: MS: Once sedation is weaned, she follows commands readily bilaterally, fixates and tracks across midline. CN: Fixates and tracks across midline bilaterally Motor: Moves all extremities well Sensory: Endorses symmetric sensation  Pertinent Labs: CMP- elevations in AST and ALT MRI brain-normal   Impression: 37 year old female with new onset seizures.  She is following commands, but without a clear etiology and low-grade fevers, I do think an LP is indicated at this time.  She has any evidence of infection, would start antibiotics at that time.   Recommendations: 1) LP for cells, glucose, protein, cultures, HSV 2) I favor continuing Keppra monotherapy for the time being, Keppra 1 g twice daily 3) we will continue to follow  Ritta Slot, MD [facility] [phone]  If 7pm- 7am, please page neurology on call as listed in AMION.

## 2018-03-01 NOTE — Procedures (Signed)
Extubation Procedure Note  Patient Details:   Name: [name] DOB: 08/20/1980 MRN: [id number]   Airway Documentation:    Vent end date: 03/01/18 Vent end time: 1408   Evaluation  O2 sats: stable throughout Complications: No apparent complications Patient did tolerate procedure well. Bilateral Breath Sounds: Clear   Yes: patient was able to breathe around the ETT prior to extubation and was able to speak post extubation.  Nell Range 03/01/2018, 2:09 PM

## 2018-03-01 NOTE — Progress Notes (Signed)
STAT EEG completed; Dr Otelia Limes notified.

## 2018-03-02 LAB — GLUCOSE, CAPILLARY
GLUCOSE-CAPILLARY: 78 mg/dL (ref 70–99)
GLUCOSE-CAPILLARY: 85 mg/dL (ref 70–99)
Glucose-Capillary: 87 mg/dL (ref 70–99)

## 2018-03-02 LAB — MAGNESIUM: MAGNESIUM: 2 mg/dL (ref 1.7–2.4)

## 2018-03-02 LAB — BASIC METABOLIC PANEL
Anion gap: 3 — ABNORMAL LOW (ref 5–15)
BUN: <5 mg/dL — ABNORMAL LOW (ref 6–20)
CALCIUM: 8.3 mg/dL — AB (ref 8.9–10.3)
CO2: 26 mmol/L (ref 22–32)
CREATININE: 0.8 mg/dL (ref 0.44–1.00)
Chloride: 110 mmol/L (ref 98–111)
Glucose, Bld: 101 mg/dL — ABNORMAL HIGH (ref 70–99)
Potassium: 3.7 mmol/L (ref 3.5–5.1)
SODIUM: 139 mmol/L (ref 135–145)

## 2018-03-02 LAB — CBC
HEMATOCRIT: 32.6 % — AB (ref 36.0–46.0)
Hemoglobin: 9.9 g/dL — ABNORMAL LOW (ref 12.0–15.0)
MCH: 26.9 pg (ref 26.0–34.0)
MCHC: 30.4 g/dL (ref 30.0–36.0)
MCV: 88.6 fL (ref 80.0–100.0)
Platelets: 189 10*3/uL (ref 150–400)
RBC: 3.68 MIL/uL — ABNORMAL LOW (ref 3.87–5.11)
RDW: 15 % (ref 11.5–15.5)
WBC: 8.2 10*3/uL (ref 4.0–10.5)
nRBC: 0 % (ref 0.0–0.2)

## 2018-03-02 LAB — PHOSPHORUS: PHOSPHORUS: 2.2 mg/dL — AB (ref 2.5–4.6)

## 2018-03-02 LAB — HERPES SIMPLEX VIRUS(HSV) DNA BY PCR
HSV 1 DNA: NEGATIVE
HSV 2 DNA: NEGATIVE

## 2018-03-02 LAB — PROCALCITONIN: Procalcitonin: <0.1 ng/mL

## 2018-03-02 MED ORDER — METHADONE HCL 10 MG PO TABS: 132.0000 mg | ORAL_TABLET | Freq: Every day | ORAL | Status: DC

## 2018-03-02 MED ORDER — IBUPROFEN 200 MG PO TABS
400.0000 mg | ORAL_TABLET | Freq: Four times a day (QID) | ORAL | Status: DC | PRN
  Administered 2018-03-02 (×2): 400 mg via ORAL
  Filled 2018-03-02 (×2): qty 2

## 2018-03-02 MED ORDER — PANTOPRAZOLE SODIUM 40 MG PO TBEC
40.0000 mg | DELAYED_RELEASE_TABLET | Freq: Every day | ORAL | Status: DC
  Administered 2018-03-03: 40 mg via ORAL
  Filled 2018-03-02: qty 1

## 2018-03-02 MED ORDER — METHADONE HCL 10 MG PO TABS
100.0000 mg | ORAL_TABLET | Freq: Every day | ORAL | Status: DC
  Administered 2018-03-03: 100 mg
  Filled 2018-03-02: qty 10

## 2018-03-02 MED ORDER — PANTOPRAZOLE SODIUM 40 MG PO PACK: 40.0000 mg | PACK | Freq: Every day | ORAL | Status: DC

## 2018-03-02 NOTE — Plan of Care (Signed)
Patient transferred from ICU tonight.  She follows commands but is very teary and confused why she is in hospital.  Try to reorient and explain why she was admitted but she does not process appropriately.

## 2018-03-02 NOTE — Progress Notes (Signed)
eLink Physician-Brief Progress Note Patient Name: [name] DOB: 1980/08/15 MRN: [id number]   Date of Service  03/02/2018  HPI/Events of Note  Patient c/o back pain - Creatinine = 0.97.  eICU Interventions  Will order: 1. Motrin 400 mg PO Q 6 hours PRN pain.     Intervention Category Intermediate Interventions: Pain - evaluation and management  Mackenzi Krogh Eugene 03/02/2018, 12:52 AM

## 2018-03-02 NOTE — Progress Notes (Signed)
Spoke to E-Link MD about patient's back pain and that the patient had no medications ordered to treat pain. E-Link MD also made aware of patient's past opioid use and ongoing methadone treatment. New orders received for oral ibuprofen 400 mg. Will continue to monitor.

## 2018-03-02 NOTE — Progress Notes (Signed)
[place] TEAM 1 - Stepdown/ICU TEAM  [name]  ACZ:[id number] DOB: July 14, 1980 DOA: 02/28/2018 PCP: Patient, No Pcp Per    Brief Narrative:  37 year old with a history of asthma and chronic methadone use who was admitted with new onset seizures, intubated for status epilepticus.    Subjective: Very somnolent today. Denies HA, sob, n/v, chest pain. No recurrent seizure activity appreciated.   Assessment & Plan:  New onset seizure Antiepileptics per Neuro - etiology unclear - LP not c/w meningitis - MRI brain w/o acute findings   Acute respiratory failure  Due to seizure - resolved - stas stable on RA post extubation   Microscopic Proteinuria Cr stable - consider recheck UA in outpt f/u     Transaminitis likely secondary to seizure - f/u in AM   Hx asthma Rarely uses albuterol inhaler  Methadone use - hx of opiate dependence no drug use currently - is compliant with 176m methadone daily, takes in AM - has been on that dose for >1 year  DVT prophylaxis: SQ heparin  Code Status: FULL CODE Family Communication: spoke w/ mother and daughter at bedside  Disposition Plan: SDU  Consultants:  Neurology PCCM  Antimicrobials:  none  Objective: Blood pressure 107/71, pulse 64, temperature 98.4 F (36.9 C), resp. rate 17, height '5\' 7"'  (1.702 m), weight 76.5 kg, SpO2 96 %.  Intake/Output Summary (Last 24 hours) at 03/02/2018 1510 Last data filed at 03/02/2018 1200 Gross per 24 hour  Intake 1656.67 ml  Output 500 ml  Net 1156.67 ml   Filed Weights   02/28/18 2142 03/01/18 0139 03/02/18 0551  Weight: 74.8 kg 76.8 kg 76.5 kg    Examination: General: No acute respiratory distress Lungs: Clear to auscultation bilaterally without wheezes or crackles Cardiovascular: Regular rate and rhythm without murmur gallop or rub normal S1 and S2 Abdomen: Nontender, nondistended, soft, bowel sounds positive, no rebound, no ascites, no appreciable mass Extremities: No  significant cyanosis, clubbing, or edema bilateral lower extremities  CBC: Recent Labs  Lab 02/27/18 1301 02/28/18 2147 02/28/18 2228 03/02/18 0417  WBC 8.2  --  23.1* 8.2  NEUTROABS  --   --  20.7*  --   HGB 12.7 13.6 10.6* 9.9*  HCT 40.4 40.0 34.3* 32.6*  MCV 87.4  --  89.8 88.6  PLT 248  --  219 1109  Basic Metabolic Panel: Recent Labs  Lab 02/27/18 1301 02/28/18 2138 02/28/18 2147 02/28/18 2201 03/01/18 0049 03/02/18 0417  NA 138 137 142  --  137 139  K 3.7 3.1* 3.2*  --  3.7 3.7  CL 106 103 108  --  107 110  CO2 24 13*  --   --  21* 26  GLUCOSE 106* 230* 216*  --  154* 101*  BUN '7 8 7  ' --  7 <5*  CREATININE 0.92 1.15* 0.90  --  0.97 0.80  CALCIUM 9.3 9.1  --   --  7.8* 8.3*  MG 2.0  --   --  2.1  --  2.0  PHOS  --   --   --   --   --  2.2*   GFR: Estimated Creatinine Clearance: 104.7 mL/min (by C-G formula based on SCr of 0.8 mg/dL).  Liver Function Tests: Recent Labs  Lab 02/28/18 2138 03/01/18 0049  AST 169* 199*  ALT 114* 130*  ALKPHOS 64 58  BILITOT 0.4 0.7  PROT 7.2 6.0*  ALBUMIN 4.1 3.6    CBG:  Recent Labs  Lab 03/01/18 1514 03/01/18 1926 03/01/18 2315 03/02/18 0259 03/02/18 0827  GLUCAP 85 74 78 85 87    Recent Results (from the past 240 hour(s))  MRSA PCR Screening     Status: None   Collection Time: 03/01/18  1:41 AM  Result Value Ref Range Status   MRSA by PCR NEGATIVE NEGATIVE Final    Comment:        The GeneXpert MRSA Assay (FDA approved for NASAL specimens only), is one component of a comprehensive MRSA colonization surveillance program. It is not intended to diagnose MRSA infection nor to guide or monitor treatment for MRSA infections. Performed at [facility], [place] [address] [place], [place] [address]   CSF culture with Stat gram stain     Status: None (Preliminary result)   Collection Time: 03/01/18  9:55 AM  Result Value Ref Range Status   Specimen Description CSF  Final   Special Requests NONE  Final     Gram Stain NO WBC SEEN NO ORGANISMS SEEN CYTOSPIN SMEAR   Final   Culture   Final    NO GROWTH 1 DAY Performed at [facility], [place] [address], [place], [place] [address]    Report Status PENDING  Incomplete     Scheduled Meds: . chlorhexidine gluconate (MEDLINE KIT)  15 mL Mouth Rinse BID  . heparin  5,000 Units Subcutaneous Q8H  . insulin aspart  2-6 Units Subcutaneous Q4H  . [START ON 03/03/2018] methadone  130 mg Oral Daily  . [START ON 03/03/2018] pantoprazole  40 mg Oral Daily   Continuous Infusions: . sodium chloride 50 mL/hr at 03/01/18 0600  . dextrose 5 % and 0.45% NaCl Stopped (03/02/18 0931)  . levETIRAcetam Stopped (03/02/18 0947)     LOS: 2 days   Cherene Altes, MD [facility] Office  [phone] Pager - Text Page per Amion  If 7PM-7AM, please contact night-coverage per Amion 03/02/2018, 3:10 PM
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
## 2018-03-03 LAB — CBC
HEMATOCRIT: 31.5 % — AB (ref 36.0–46.0)
HEMOGLOBIN: 10 g/dL — AB (ref 12.0–15.0)
MCH: 27.5 pg (ref 26.0–34.0)
MCHC: 31.7 g/dL (ref 30.0–36.0)
MCV: 86.5 fL (ref 80.0–100.0)
NRBC: 0 % (ref 0.0–0.2)
Platelets: 192 10*3/uL (ref 150–400)
RBC: 3.64 MIL/uL — ABNORMAL LOW (ref 3.87–5.11)
RDW: 14.9 % (ref 11.5–15.5)
WBC: 6.9 10*3/uL (ref 4.0–10.5)

## 2018-03-03 LAB — COMPREHENSIVE METABOLIC PANEL
ALT: 138 U/L — ABNORMAL HIGH (ref 0–44)
AST: 216 U/L — ABNORMAL HIGH (ref 15–41)
Albumin: 3 g/dL — ABNORMAL LOW (ref 3.5–5.0)
Alkaline Phosphatase: 41 U/L (ref 38–126)
Anion gap: 4 — ABNORMAL LOW (ref 5–15)
CHLORIDE: 112 mmol/L — AB (ref 98–111)
CO2: 24 mmol/L (ref 22–32)
Calcium: 8.3 mg/dL — ABNORMAL LOW (ref 8.9–10.3)
Creatinine, Ser: 0.77 mg/dL (ref 0.44–1.00)
GFR calc Af Amer: >60 mL/min (ref >60)
Glucose, Bld: 89 mg/dL (ref 70–99)
POTASSIUM: 3.1 mmol/L — AB (ref 3.5–5.1)
SODIUM: 140 mmol/L (ref 135–145)
Total Bilirubin: 0.5 mg/dL (ref 0.3–1.2)
Total Protein: 5.4 g/dL — ABNORMAL LOW (ref 6.5–8.1)

## 2018-03-03 LAB — GLUCOSE, CAPILLARY: Glucose-Capillary: 106 mg/dL — ABNORMAL HIGH (ref 70–99)

## 2018-03-03 MED ORDER — LEVETIRACETAM 500 MG PO TABS: 500.0000 mg | ORAL_TABLET | Freq: Two times a day (BID) | ORAL | 0 refills | Status: DC

## 2018-03-03 MED ORDER — POTASSIUM CHLORIDE CRYS ER 20 MEQ PO TBCR
40.0000 meq | EXTENDED_RELEASE_TABLET | Freq: Once | ORAL | Status: AC
  Administered 2018-03-03: 40 meq via ORAL
  Filled 2018-03-03: qty 2

## 2018-03-03 MED ORDER — LEVETIRACETAM 500 MG PO TABS: 500.0000 mg | ORAL_TABLET | Freq: Two times a day (BID) | ORAL | Status: DC

## 2018-03-03 MED ORDER — METHADONE HCL 10 MG PO TABS: 100.0000 mg | ORAL_TABLET | Freq: Every day | ORAL | Status: DC

## 2018-03-03 NOTE — Progress Notes (Signed)
Physical Therapy Treatment and Discharge Patient Details Name: [name] MRN: [id number] DOB: 04-24-1981 Today's Date: 03/03/2018    History of Present Illness 37 year old with a history of asthma, here with new onset seizures, intubated for status epilepticus.  Pt with h/o opiate dependence (pills). She is on methadone daily.    PT Comments    Pt significantly better both cognitively and functionally. Pt functioning near baseline and has met all goals. Pt a minimal falls risk as indicated by score of 23/24 on DGI. Pt with no further acute PT needs at this time. PT SIGNING OFF. Please re-consult if needed in future.   Follow Up Recommendations  (no need for PT services)     Equipment Recommendations  None recommended by PT    Recommendations for Other Services       Precautions / Restrictions Precautions Precautions: None Restrictions Weight Bearing Restrictions: No    Mobility  Bed Mobility               General bed mobility comments: pt up in chair upon PT arrival  Transfers Overall transfer level: Independent Equipment used: None Transfers: Sit to/from Stand Sit to Stand: Independent         General transfer comment: no difficulty  Ambulation/Gait Ambulation/Gait assistance: Independent Gait Distance (Feet): 600 Feet Assistive device: None Gait Pattern/deviations: WFL(Within Functional Limits) Gait velocity: wfl Gait velocity interpretation: >2.62 ft/sec, indicative of community ambulatory General Gait Details: pt significantly improved, no signs of instability, able to navigate back to room   Stairs Stairs: Yes Stairs assistance: Modified independent (Device/Increase time) Stair Management: One rail Right Number of Stairs: 12 General stair comments: no difficulty   Wheelchair Mobility    Modified Rankin (Stroke Patients Only)       Balance                                 Standardized Balance  Assessment Standardized Balance Assessment : Dynamic Gait Index   Dynamic Gait Index Level Surface: Normal Change in Gait Speed: Normal Gait with Horizontal Head Turns: Normal Gait with Vertical Head Turns: Normal Gait and Pivot Turn: Normal Step Over Obstacle: Normal Step Around Obstacles: Normal Steps: Mild Impairment Total Score: 23      Cognition Arousal/Alertness: Awake/alert Behavior During Therapy: WFL for tasks assessed/performed Overall Cognitive Status: Within Functional Limits for tasks assessed                                 General Comments: pt initiating conversation and having appropriate conversations and asking appropriate questions. Pt remains tearful regarding hospital course and situation due to not remembering the event      Exercises      General Comments        Pertinent Vitals/Pain Pain Assessment: No/denies pain    Home Living                      Prior Function            PT Goals (current goals can now be found in the care plan section) Acute Rehab PT Goals Patient Stated Goal: home Progress towards PT goals: Goals met/education completed, patient discharged from PT    Frequency    (discharge)      PT Plan Other (comment)(pt no longer needs acute PT)  Co-evaluation              AM-PAC PT "6 Clicks" Daily Activity  Outcome Measure  Difficulty turning over in bed (including adjusting bedclothes, sheets and blankets)?: None Difficulty moving from lying on back to sitting on the side of the bed? : None Difficulty sitting down on and standing up from a chair with arms (e.g., wheelchair, bedside commode, etc,.)?: None Help needed moving to and from a bed to chair (including a wheelchair)?: None Help needed walking in hospital room?: None Help needed climbing 3-5 steps with a railing? : None 6 Click Score: 24    End of Session Equipment Utilized During Treatment: Gait belt Activity Tolerance:  Patient tolerated treatment well Patient left: in chair;with call bell/phone within reach;with family/visitor present Nurse Communication: Mobility status       Time: 1048-1100 PT Time Calculation (min) (ACUTE ONLY): 12 min  Charges:  $Gait Training: 8-22 mins                     Kittie Plater, PT, DPT Acute Rehabilitation Services Pager #: [phone] Office #: [phone]    Berline Lopes 03/03/2018, 11:16 AM

## 2018-03-03 NOTE — Discharge Instructions (Signed)
Per the Neurology Team:  Per [facility] statutes, patients with seizures are not allowed to drive until  they have been seizure-free for six months. Use caution when using heavy equipment or power tools. Avoid working on ladders or at heights. Take showers instead of baths. Ensure the water temperature is not too high on the home water heater. Do not go swimming alone. When caring for infants or small children, sit down when holding, feeding, or changing them to minimize risk of injury to the child in the event you have a seizure.   Also, Maintain good sleep hygiene. Avoid alcohol.    Epilepsy Epilepsy is a condition in which a person has repeated seizures over time. A seizure is a sudden burst of abnormal electrical and chemical activity in the brain. Seizures can cause a change in attention, behavior, or the ability to remain awake and alert (altered mental status). Epilepsy increases a person's risk of falls, accidents, and injury. It can also lead to complications, including:  Depression.  Poor memory.  Sudden unexplained death in epilepsy (SUDEP). This complication is rare, and its cause is not known.  Most people with epilepsy lead normal lives. What are the causes? This condition may be caused by:  A head injury.  An injury that happens at birth.  A high fever during childhood.  A stroke.  Bleeding that goes into or around the brain.  Certain medicines and drugs.  Having too little oxygen for a long period of time.  Abnormal brain development.  Certain infections, such as meningitis and encephalitis.  Brain tumors.  Conditions that are passed along from parent to child (are hereditary).  What are the signs or symptoms? Symptoms of a seizure vary greatly from person to person. They include:  Convulsions.  Stiffening of the body.  Involuntary movements of the arms or legs.  Loss of consciousness.  Breathing problems.  Falling  suddenly.  Confusion.  Head nodding.  Eye blinking or fluttering.  Lip smacking.  Drooling.  Rapid eye movements.  Grunting.  Loss of bladder control and bowel control.  Staring.  Unresponsiveness.  Some people have symptoms right before a seizure happens (aura) and right after a seizure happens. Symptoms of an aura include:  Fear or anxiety.  Nausea.  Feeling like the room is spinning (vertigo).  A feeling of having seen or heard something before (deja vu).  Odd tastes or smells.  Changes in vision, such as seeing flashing lights or spots.  Symptoms that follow a seizure include:  Confusion.  Sleepiness.  Headache.  How is this diagnosed? This condition is diagnosed based on:  Your symptoms.  Your medical history.  A physical exam.  A neurological exam. A neurological exam is similar to a physical exam. It involves checking your strength, reflexes, coordination, and sensations.  Tests, such as: ? An electroencephalogram (EEG). This is a painless test that creates a diagram of your brain waves. ? An MRI of the brain. ? A CT scan of the brain. ? A lumbar puncture, also called a spinal tap. ? Blood tests to check for signs of infection or abnormal blood chemistry.  How is this treated? There is no cure for this condition, but treatment can help control seizures. Treatment may involve:  Taking medicines to control seizures. These include medicines to prevent seizures and medicines to stop seizures as they occur.  Having a device called a vagus nerve stimulator implanted in the chest. The device sends electrical impulses to  the vagus nerve and to the brain to prevent seizures. This treatment may be recommended if medicines do not help.  Brain surgery. There are several kinds of surgeries that may be done to stop seizures from happening or to reduce how often seizures happen.  Having regular blood tests. You may need to have blood tests regularly to  check that you are getting the right amount of medicine.  Once this condition has been diagnosed, it is important to begin treatment as soon as possible. For some people, epilepsy eventually goes away. Follow these instructions at home: Medicines   Take over-the-counter and prescription medicines only as told by your health care provider.  Avoid any substances that may prevent your medicine from working properly, such as alcohol. Activity  Get enough rest. Lack of sleep can make seizures more likely to occur.  Follow instructions from your health care provider about driving, swimming, and doing any other activities that would be dangerous if you had a seizure. Educating others Teach friends and family what to do if you have a seizure. They should:  Lay you on the ground to prevent a fall.  Cushion your head and body.  Loosen any tight clothing around your neck.  Turn you on your side. If vomiting occurs, this helps keep your airway clear.  Stay with you until you recover.  Not hold you down. Holding you down will not stop the seizure.  Not put anything in your mouth.  Know whether or not you need emergency care.  General instructions  Avoid anything that has ever triggered a seizure for you.  Keep a seizure diary. Record what you remember about each seizure, especially anything that might have triggered the seizure.  Keep all follow-up visits as told by your health care provider. This is important. Contact a health care provider if:  Your seizure pattern changes.  You have symptoms of infection or another illness. This might increase your risk of having a seizure. Get help right away if:  You have a seizure that does not stop after 5 minutes.  You have several seizures in a row without a complete recovery in between seizures.  You have a seizure that makes it harder to breathe.  You have a seizure that is different from previous seizures.  You have a seizure  that leaves you unable to speak or use a part of your body.  You did not wake up immediately after a seizure. This information is not intended to replace advice given to you by your health care provider. Make sure you discuss any questions you have with your health care provider. Document Released: 04/15/2005 Document Revised: 11/11/2015 Document Reviewed: 10/24/2015 Elsevier Interactive Patient Education  [employer].

## 2018-03-03 NOTE — Care Management Note (Signed)
Case Management Note  Patient Details  Name: VERNADETTE STUTSMAN MRN: [id number] Date of Birth: 10-21-1980  Subjective/Objective:  37 year old with a history of asthma, here with new onset seizures, intubated for status epilepticus.  PTA, pt independent, lives at home with 18yo daughter and mother.                    Action/Plan: PT recommended HH follow up after evaluation yesterday, but after evaluation today, state pt needs no f/u.  Pt states she has PCP listed on her Medicaid card, and has no issues getting meds filled.  She denies any needs for home.  Family to provide assistance if needed.  Expected Discharge Date:  03/03/18               Expected Discharge Plan:  Home/Self Care  In-House Referral:     Discharge planning Services  CM Consult  Post Acute Care Choice:    Choice offered to:     DME Arranged:    DME Agency:     HH Arranged:    HH Agency:     Status of Service:  Completed, signed off  If discussed at [employer] of Stay Meetings, dates discussed:    Additional Comments:  Quintella Baton, RN, BSN  Trauma/Neuro ICU Case Manager [phone]

## 2018-03-03 NOTE — Discharge Summary (Signed)
DISCHARGE SUMMARY  [name]  MR#: [id number]  DOB:[birth year]  Date of Admission: 02/28/2018 Date of Discharge: 03/03/2018  Attending Physician:Jeffrey Silvestre Gunner, MD  Patient's WUJ:[id number], No Pcp Per  Consults: Neurology PCCM  Disposition: D/C home   Follow-up Appts: Follow-up Information    [facility] Follow up in 2 week(s).   Contact information: [address]     [address] [place] [address] [phone]       [facility] Follow up in 2 week(s).   Contact information: [address] [place] [address] [phone]          Tests Needing Follow-up: -assess for recurrent of seizure activity and tolerance of seizure meds -assess for fitness to return to driving  -f/u incidentally noted microscopic proteinuria  -f/u LFTs which were modestly elevated at the time of d/c   Discharge Diagnoses: New onset seizure Acute hypoxic respiratory failure  Microscopic Proteinuria Transaminitis Hx asthma Methadone use - hx of opiate dependence  Initial presentation: 37 year old with a history of asthma and chronic methadone use who was admitted with new onset seizures, intubated for status epilepticus.   Hospital Course:  New onset seizure Antiepileptics per Neuro - etiology unclear - LP not c/w meningitis - MRI brain w/o acute findings - cleared for d/c home per Neuro - is otherwise medically stable therefore d/c home 03/03/18  Acute hypoxic respiratory failure  Due to seizure - resolved - sats stable on RA post extubation   Microscopic Proteinuria Cr stable - recheck UA in outpt f/u    Transaminitis likely secondary to seizure - not fully resolved at time of d/c - f/u in outpt setting in 7-10 days    Hx asthma Rarely uses albuterol inhaler - medically stable at time of d/c   Methadone use - hx of opiate dependence no drug use currently - is compliant  with 132mg  methadone daily, takes in AM - has been on that dose for >1 year - resume per ADS protocol    Allergies as of 03/03/2018   No Known Allergies     Medication List    TAKE these medications   AMBIEN 10 MG tablet Generic drug:  zolpidem Take 10 mg by mouth daily as needed (sleep).   beclomethasone 40 MCG/ACT inhaler Commonly known as:  QVAR Inhale 2 puffs into the lungs 2 (two) times daily.   ibuprofen 200 MG tablet Commonly known as:  ADVIL,MOTRIN Take 400 mg by mouth every 6 (six) hours as needed for pain.   levETIRAcetam 500 MG tablet Commonly known as:  KEPPRA Take 1 tablet (500 mg total) by mouth 2 (two) times daily.   METHADONE HCL PO Take 132 mg by mouth daily.       Day of Discharge BP 114/63 (BP Location: Left Arm)   Pulse 80   Temp 98.5 F (36.9 C)   Resp 14   Ht 5\' 7"  (1.702 m)   Wt 77.5 kg   SpO2 98%   BMI 26.76 kg/m   Physical Exam: General: No acute respiratory distress Lungs: Clear to auscultation bilaterally without wheezes or crackles Cardiovascular: Regular rate and rhythm without murmur gallop or rub normal S1 and S2 Abdomen: Nontender, nondistended, soft, bowel sounds positive, no rebound, no ascites, no appreciable mass Extremities: No significant cyanosis, clubbing, or edema bilateral lower extremities  Basic Metabolic Panel: Recent Labs  Lab 02/27/18 1301 02/28/18 2138 02/28/18 2147 02/28/18 2201 03/01/18 0049 03/02/18  0417 03/03/18 0501  NA 138 137 142  --  137 139 140  K 3.7 3.1* 3.2*  --  3.7 3.7 3.1*  CL 106 103 108  --  107 110 112*  CO2 24 13*  --   --  21* 26 24  GLUCOSE 106* 230* 216*  --  154* 101* 89  BUN 7 8 7   --  7 <5* <5*  CREATININE 0.92 1.15* 0.90  --  0.97 0.80 0.77  CALCIUM 9.3 9.1  --   --  7.8* 8.3* 8.3*  MG 2.0  --   --  2.1  --  2.0  --   PHOS  --   --   --   --   --  2.2*  --     Liver Function Tests: Recent Labs  Lab 02/28/18 2138 03/01/18 0049 03/03/18 0501  AST 169* 199* 216*  ALT  114* 130* 138*  ALKPHOS 64 58 41  BILITOT 0.4 0.7 0.5  PROT 7.2 6.0* 5.4*  ALBUMIN 4.1 3.6 3.0*    CBC: Recent Labs  Lab 02/27/18 1301 02/28/18 2147 02/28/18 2228 03/02/18 0417 03/03/18 0501  WBC 8.2  --  23.1* 8.2 6.9  NEUTROABS  --   --  20.7*  --   --   HGB 12.7 13.6 10.6* 9.9* 10.0*  HCT 40.4 40.0 34.3* 32.6* 31.5*  MCV 87.4  --  89.8 88.6 86.5  PLT 248  --  219 189 192    CBG: Recent Labs  Lab 03/01/18 1926 03/01/18 2315 03/02/18 0259 03/02/18 0827 03/03/18 0738  GLUCAP 74 78 85 87 106*    Recent Results (from the past 240 hour(s))  Culture, blood (routine x 2)     Status: None (Preliminary result)   Collection Time: 03/01/18 12:49 AM  Result Value Ref Range Status   Specimen Description BLOOD LEFT ANTECUBITAL  Final   Special Requests   Final    BOTTLES DRAWN AEROBIC AND ANAEROBIC Blood Culture results may not be optimal due to an inadequate volume of blood received in culture bottles   Culture   Final    NO GROWTH 1 DAY Performed at [facility], [address], [place], [place] [address]    Report Status PENDING  Incomplete  Culture, blood (routine x 2)     Status: None (Preliminary result)   Collection Time: 03/01/18 12:49 AM  Result Value Ref Range Status   Specimen Description BLOOD RIGHT HAND  Final   Special Requests   Final    BOTTLES DRAWN AEROBIC AND ANAEROBIC Blood Culture adequate volume   Culture   Final    NO GROWTH 1 DAY Performed at [facility], [address], [place], [place] [address]    Report Status PENDING  Incomplete  MRSA PCR Screening     Status: None   Collection Time: 03/01/18  1:41 AM  Result Value Ref Range Status   MRSA by PCR NEGATIVE NEGATIVE Final    Comment:        The GeneXpert MRSA Assay (FDA approved for NASAL specimens only), is one component of a comprehensive MRSA colonization surveillance program. It is not intended to diagnose MRSA infection nor to guide or monitor treatment for MRSA  infections. Performed at [facility], [address] [place], [place] [address]   CSF culture with Stat gram stain     Status: None (Preliminary result)   Collection Time: 03/01/18  9:55 AM  Result Value Ref Range Status   Specimen Description CSF  Final   Special Requests NONE  Final   Gram Stain NO WBC SEEN NO ORGANISMS SEEN CYTOSPIN SMEAR   Final   Culture   Final    NO GROWTH 2 DAYS Performed at [facility], [address] [place], [place] [address]    Report Status PENDING  Incomplete     Time spent in discharge (includes decision making & examination of pt): 35 minutes  03/03/2018, 10:37 AM   Lonia Blood, MD [facility] Office  [phone] Pager [phone]  On-Call/Text Page:      [email or web address]      password [facility]

## 2018-03-04 LAB — CSF CULTURE W GRAM STAIN
Culture: NO GROWTH
Gram Stain: NONE SEEN

## 2018-03-04 LAB — CSF CULTURE

## 2018-03-06 LAB — CULTURE, BLOOD (ROUTINE X 2)
Culture: NO GROWTH
Culture: NO GROWTH
SPECIAL REQUESTS: ADEQUATE

## 2018-03-16 ENCOUNTER — Ambulatory Visit: Payer: Medicaid Other | Admitting: Neurology

## 2018-03-16 ENCOUNTER — Encounter: Payer: Self-pay | Admitting: Neurology

## 2018-03-16 VITALS — BP 139/78 | HR 54 | Ht 67.0 in | Wt 165.0 lb

## 2018-03-16 DIAGNOSIS — R079 Chest pain, unspecified: Secondary | ICD-10-CM

## 2018-03-16 DIAGNOSIS — R569 Unspecified convulsions: Secondary | ICD-10-CM | POA: Diagnosis not present

## 2018-03-16 MED ORDER — LEVETIRACETAM 500 MG PO TABS: 500.0000 mg | ORAL_TABLET | Freq: Two times a day (BID) | ORAL | 11 refills | Status: DC
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
## 2018-03-16 NOTE — Patient Instructions (Signed)
[facility]    Address: [address], [place], [place] [address]  Phone: [phone]   

## 2018-03-17 ENCOUNTER — Telehealth: Payer: Self-pay | Admitting: *Deleted

## 2018-03-17 LAB — CBC WITH DIFFERENTIAL
BASOS: 1 %
Basophils Absolute: 0 10*3/uL (ref 0.0–0.2)
EOS (ABSOLUTE): 0.1 10*3/uL (ref 0.0–0.4)
Eos: 2 %
HEMOGLOBIN: 12.8 g/dL (ref 11.1–15.9)
Hematocrit: 40 % (ref 34.0–46.6)
IMMATURE GRANS (ABS): 0 10*3/uL (ref 0.0–0.1)
Immature Granulocytes: 0 %
LYMPHS: 28 %
Lymphocytes Absolute: 1.6 10*3/uL (ref 0.7–3.1)
MCH: 27.6 pg (ref 26.6–33.0)
MCHC: 32 g/dL (ref 31.5–35.7)
MCV: 86 fL (ref 79–97)
MONOCYTES: 12 %
Monocytes Absolute: 0.6 10*3/uL (ref 0.1–0.9)
NEUTROS ABS: 3.1 10*3/uL (ref 1.4–7.0)
NEUTROS PCT: 57 %
RBC: 4.64 x10E6/uL (ref 3.77–5.28)
RDW: 13.9 % (ref 12.3–15.4)
WBC: 5.5 10*3/uL (ref 3.4–10.8)

## 2018-03-17 LAB — COMPREHENSIVE METABOLIC PANEL
ALK PHOS: 71 IU/L (ref 39–117)
ALT: 17 IU/L (ref 0–32)
AST: 14 IU/L (ref 0–40)
Albumin/Globulin Ratio: 1.9 (ref 1.2–2.2)
Albumin: 4.7 g/dL (ref 3.5–5.5)
BUN/Creatinine Ratio: 7 — ABNORMAL LOW (ref 9–23)
BUN: 6 mg/dL (ref 6–20)
Bilirubin Total: <0.2 mg/dL (ref 0.0–1.2)
CALCIUM: 10.2 mg/dL (ref 8.7–10.2)
CO2: 22 mmol/L (ref 20–29)
CREATININE: 0.82 mg/dL (ref 0.57–1.00)
Chloride: 100 mmol/L (ref 96–106)
GFR calc Af Amer: 107 mL/min/{1.73_m2} (ref >59)
GFR calc non Af Amer: 93 mL/min/{1.73_m2} (ref >59)
GLUCOSE: 86 mg/dL (ref 65–99)
Globulin, Total: 2.5 g/dL (ref 1.5–4.5)
Potassium: 4.7 mmol/L (ref 3.5–5.2)
Sodium: 141 mmol/L (ref 134–144)
Total Protein: 7.2 g/dL (ref 6.0–8.5)

## 2018-03-17 LAB — ANA W/REFLEX IF POSITIVE: ANA: NEGATIVE

## 2018-03-17 LAB — RPR: RPR: NONREACTIVE

## 2018-03-17 NOTE — Telephone Encounter (Signed)
-----   Message from Levert FeinsteinYijun Yan, MD sent at 03/17/2018  1:00 PM EST ----- Please call patient for no significant abnormalities on lab.

## 2018-03-17 NOTE — Telephone Encounter (Signed)
Left patient a detailed message, with results, on voicemail.  Provided our number to call back with any questions.  

## 2018-04-08 ENCOUNTER — Ambulatory Visit: Payer: Medicaid Other | Admitting: Neurology

## 2018-04-08 DIAGNOSIS — R569 Unspecified convulsions: Secondary | ICD-10-CM
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
## 2018-04-16 ENCOUNTER — Telehealth: Payer: Self-pay

## 2018-04-16 ENCOUNTER — Ambulatory Visit: Payer: Medicaid Other | Admitting: Adult Health

## 2018-04-16 ENCOUNTER — Encounter: Payer: Self-pay | Admitting: Adult Health

## 2018-04-16 VITALS — BP 123/71 | HR 54 | Ht 67.0 in | Wt 164.6 lb

## 2018-04-16 DIAGNOSIS — R569 Unspecified convulsions: Secondary | ICD-10-CM | POA: Diagnosis not present

## 2018-04-16 MED ORDER — LEVETIRACETAM ER 500 MG PO TB24: 1000.0000 mg | ORAL_TABLET | Freq: Every day | ORAL | 4 refills | Status: DC

## 2018-04-16 NOTE — Progress Notes (Signed)
I have reviewed and agreed above plan. 

## 2018-04-16 NOTE — Addendum Note (Signed)
Addended by: George HughVANSCHAICK, Kirby Cortese on: 04/16/2018 01:40 PM   Modules accepted: Orders

## 2018-04-16 NOTE — Telephone Encounter (Signed)
[name], [name], NP  Hildred AlaminMurrell, Katrina Y, RN        Please advise patient that Dr. Terrace ArabiaYan reviewed EEG and did not show any abnormalities. She also recommends that you continue Keppra long-term. We can consider switching this to Keppra ER which she will take at night and this could help reduce her symptoms of fatigue. Please let me know if she would like to switch at this time and a prescription will be sent to her pharmacy

## 2018-04-16 NOTE — Telephone Encounter (Signed)
Rn call patient back that EEG did not show any abnormalities. Its recommend that she continue keppra long term. Shanda BumpsJessica NP can consider switching to keppra ER at night. Pt stated she would like to take and try it.

## 2018-04-16 NOTE — Telephone Encounter (Signed)
Rn call patient that [name] BumpsJessica NP sent in keppra XR 500mg  capsule to the pharmacy. Rn stated she will take 2 capsules at bedtime to see if it helps her fatigue symptoms. PT verbalized understanding of only taking keppra xr 1000mg  at night.

## 2018-04-16 NOTE — Telephone Encounter (Signed)
Discontinued Keppra IR and sent Keppra XR 1000mg  (2 500mg  capsules) daily at bedtime to see if this helps with her continued fatigue symptoms.

## 2018-04-16 NOTE — Telephone Encounter (Signed)
Pt called requesting a call back from RN to discuss Keppra, please advise

## 2018-04-16 NOTE — Patient Instructions (Addendum)
Your Plan:  Continue Keppra 500mg  twice a day for the time being  We will call you with the results of the EEG testing  Please call us if your headaches do not subside     Follow up in 3 months or call earlier if needed     Thank you for [facility] at [facility]. I hope we have been able to provide you high quality care today.  You may receive a patient satisfaction survey over the next few weeks. We would appreciate your feedback and comments so that we may continue to improve ourselves and the health of our patients.

## 2018-04-16 NOTE — Telephone Encounter (Signed)
Rn tried to call patients cell phone but it did have vm set up.Rn call patients mom to ask if daughter was available. Mom on dpr stated she was not home at the time. Rn stated per Shanda BumpsJessica NP the EEG did not show any abnormalities. Shanda BumpsJessica NP can consider changing to  Keppra ER which she will take at night this can help reduce fatigue. PTs mom stated she will have Necie call us back if she wants to try the keppra ER.

## 2018-04-17 NOTE — Procedures (Signed)
   HISTORY: 37 years old female, with newly diagnosis of seizure disorder  TECHNIQUE:  This is a routine 16 channel EEG recording with one channel devoted to a limited EKG recording.  It was performed during wakefulness, drowsiness and asleep.  Hyperventilation and photic stimulation were performed as activating procedures.  There are frequent muscle, movement and eye blinking artifact noted.  Upon maximum arousal, posterior dominant waking rhythm consistent of rhythmic alpha range activity, with frequency of 9 hz. Activities are symmetric over the bilateral posterior derivations and attenuated with eye opening.  Hyperventilation produced mild/moderate buildup with higher amplitude and the slower activities noted.  Photic stimulation did not alter the tracing.  During EEG recording, patient developed drowsiness and no deeper stage of sleep was achieved  During EEG recording, there was no epileptiform discharge noted.  EKG demonstrate regular sinus rhythm CONCLUSION: This is a  normal awake EEG.  There is no electrodiagnostic evidence of epileptiform discharge.  Levert FeinsteinYijun Deray Dawes, M.D. Ph.D.  [facility] [address] [place], [place] [address] Phone: [phone] Fax:      [phone]

## 2018-07-16 ENCOUNTER — Other Ambulatory Visit: Payer: Self-pay

## 2018-07-16 ENCOUNTER — Encounter: Payer: Self-pay | Admitting: Neurology

## 2018-07-16 ENCOUNTER — Ambulatory Visit: Payer: Medicaid Other | Admitting: Neurology

## 2018-07-16 VITALS — BP 111/67 | HR 60 | Ht 67.0 in | Wt 169.2 lb

## 2018-07-16 DIAGNOSIS — R569 Unspecified convulsions: Secondary | ICD-10-CM | POA: Diagnosis not present

## 2018-07-16 MED ORDER — LEVETIRACETAM ER 500 MG PO TB24: 1000.0000 mg | ORAL_TABLET | Freq: Every day | ORAL | 5 refills | Status: DC
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
## 2018-07-16 NOTE — Progress Notes (Signed)
I have reviewed and agreed above plan. 

## 2019-01-17 NOTE — Progress Notes (Deleted)
PATIENT: [name] DOB: [birth year]  REASON FOR VISIT: follow up HISTORY FROM: patient  HISTORY OF PRESENT ILLNESS: Today 01/17/19  HISTORY UPDATE July 16 2018 SS: Initial seizure 2019, several seizures in ED, requiring intubation, was discharged home on Keppra 500 mg BID.  She reported side effects of fatigue, irritability.  In December 2019 she was switched to Keppra extended release 500 mg tablets taking 2 at bedtime.  Today she reports, fatigue is better.  Is tolerating the medication well.  She does report since her seizure she has had daily headaches, frontal, sensitive to light/sound.  She reports the headache will develop throughout the day, 6/10 pain.  She will take Tylenol and Tylenol does help.  She reports she is not working right now.  She is not driving a car.  She denies any dizziness, falls, trouble walking.  She has not had any seizure events.  She presents today for follow-up accompanied by her mom.   REVIEW OF SYSTEMS: Out of a complete 14 system review of symptoms, the patient complains only of the following symptoms, and all other reviewed systems are negative.  ALLERGIES: No Known Allergies  HOME MEDICATIONS: Outpatient Medications Prior to Visit  Medication Sig Dispense Refill  . beclomethasone (QVAR) 40 MCG/ACT inhaler Inhale 2 puffs into the lungs 2 (two) times daily. 1 Inhaler 0  . ibuprofen (ADVIL,MOTRIN) 200 MG tablet Take 400 mg by mouth every 6 (six) hours as needed for pain.    Marland Kitchen levETIRAcetam (KEPPRA XR) 500 MG 24 hr tablet Take 2 tablets (1,000 mg total) by mouth at bedtime. 60 tablet 5  . METHADONE HCL PO Take 132 mg by mouth daily.    Marland Kitchen zolpidem (AMBIEN) 10 MG tablet Take 10 mg by mouth daily as needed (sleep).     No facility-administered medications prior to visit.     PAST MEDICAL HISTORY: Past Medical History:  Diagnosis Date  . Asthma   . History of opioid abuse ([place])    currently on Methadone   . Insomnia   . Seizures ([place])      PAST SURGICAL HISTORY: Past Surgical History:  Procedure Laterality Date  . abscess surgery    . TONSILLECTOMY    . TUBAL LIGATION      FAMILY HISTORY: Family History  Problem Relation Age of Onset  . Hypertension Mother   . Other Father        fatty liver, reports he had a seizure when he died    SOCIAL HISTORY: Social History   Socioeconomic History  . Marital status: Single    Spouse name: Not on file  . Number of children: 3  . Years of education: 9th  . Highest education level: Not on file  Occupational History  . Occupation: stay at home mother  Social Needs  . Financial resource strain: Not on file  . Food insecurity    Worry: Not on file    Inability: Not on file  . Transportation needs    Medical: Not on file    Non-medical: Not on file  Tobacco Use  . Smoking status: Current Every Day Smoker    Packs/day: 0.50    Types: Cigarettes  . Smokeless tobacco: Never Used  Substance and Sexual Activity  . Alcohol use: No  . Drug use: No  . Sexual activity: Not Currently    Birth control/protection: Surgical  Lifestyle  . Physical activity    Days per week: Not on file  Minutes per session: Not on file  . Stress: Not on file  Relationships  . Social Musicianconnections    Talks on phone: Not on file    Gets together: Not on file    Attends religious service: Not on file    Active member of club or organization: Not on file    Attends meetings of clubs or organizations: Not on file    Relationship status: Not on file  . Intimate partner violence    Fear of current or ex partner: Not on file    Emotionally abused: Not on file    Physically abused: Not on file    Forced sexual activity: Not on file  Other Topics Concern  . Not on file  Social History Narrative   Lives at home with her mother and three children.   Right-handed.   Two 20oz bottles of [employer]ountain [employer] daily.      PHYSICAL EXAM  There were no vitals filed for this visit. There is no  height or weight on file to calculate BMI.  Generalized: Well developed, in no acute distress   Neurological examination  Mentation: Alert oriented to time, place, history taking. Follows all commands speech and language fluent Cranial nerve II-XII: Pupils were equal round reactive to light. Extraocular movements were full, visual field were full on confrontational test. Facial sensation and strength were normal. Uvula tongue midline. Head turning and shoulder shrug  were normal and symmetric. Motor: The motor testing reveals 5 over 5 strength of all 4 extremities. Good symmetric motor tone is noted throughout.  Sensory: Sensory testing is intact to soft touch on all 4 extremities. No evidence of extinction is noted.  Coordination: Cerebellar testing reveals good finger-nose-finger and heel-to-shin bilaterally.  Gait and station: Gait is normal. Tandem gait is normal. Romberg is negative. No drift is seen.  Reflexes: Deep tendon reflexes are symmetric and normal bilaterally.   DIAGNOSTIC DATA (LABS, IMAGING, TESTING) - I reviewed patient records, labs, notes, testing and imaging myself where available.  Lab Results  Component Value Date   WBC 5.5 03/16/2018   HGB 12.8 03/16/2018   HCT 40.0 03/16/2018   MCV 86 03/16/2018   PLT 192 03/03/2018      Component Value Date/Time   NA 141 03/16/2018 1050   K 4.7 03/16/2018 1050   CL 100 03/16/2018 1050   CO2 22 03/16/2018 1050   GLUCOSE 86 03/16/2018 1050   GLUCOSE 89 03/03/2018 0501   BUN 6 03/16/2018 1050   CREATININE 0.82 03/16/2018 1050   CALCIUM 10.2 03/16/2018 1050   PROT 7.2 03/16/2018 1050   ALBUMIN 4.7 03/16/2018 1050   AST 14 03/16/2018 1050   ALT 17 03/16/2018 1050   ALKPHOS 71 03/16/2018 1050   BILITOT <0.2 03/16/2018 1050   GFRNONAA 93 03/16/2018 1050   GFRAA 107 03/16/2018 1050   Lab Results  Component Value Date   TRIG 41 03/01/2018   No results found for: HGBA1C No results found for: VITAMINB12 Lab Results   Component Value Date   TSH 1.380 03/01/2018      ASSESSMENT AND PLAN 38 y.o. year old female  has a past medical history of Asthma, History of opioid abuse (HCC), Insomnia, and Seizures (HCC). here with ***   I spent 15 minutes with the patient. 50% of this time was spent   Margie EgeSarah Yong Grieser, [place]NP-C, [place]NP 01/17/2019, 7:36 PM [facility] [address], [address] [place], [place] [address] [phone]

## 2019-01-18 ENCOUNTER — Telehealth: Payer: Self-pay

## 2019-01-18 ENCOUNTER — Ambulatory Visit: Payer: Medicaid Other | Admitting: Neurology

## 2019-01-18 NOTE — Telephone Encounter (Signed)
Patient was a no call/no show for their appointment today.   

## 2019-01-19 ENCOUNTER — Encounter: Payer: Self-pay | Admitting: Neurology

## 2019-04-07 ENCOUNTER — Telehealth: Payer: Self-pay | Admitting: Neurology

## 2019-04-07 MED ORDER — LEVETIRACETAM ER 500 MG PO TB24: 1000.0000 mg | ORAL_TABLET | Freq: Every day | ORAL | 5 refills | Status: DC

## 2019-04-07 NOTE — Telephone Encounter (Signed)
I called and LMVM for [facility] [phone] to cancel prescription just escribed keppra as pt wants to go to [facility].  Call back if quesitons.

## 2019-04-07 NOTE — Addendum Note (Signed)
Addended by: Oliver Hum S on: 04/07/2019 03:00 PM   Modules accepted: Orders

## 2019-04-07 NOTE — Telephone Encounter (Signed)
1) Medication(s) Requested (by name): keppra 2) Pharm[facility] of Choice: [facility]

## 2019-06-22 ENCOUNTER — Encounter (HOSPITAL_COMMUNITY): Payer: Self-pay

## 2019-06-22 ENCOUNTER — Ambulatory Visit (HOSPITAL_COMMUNITY)
Admission: EM | Admit: 2019-06-22 | Discharge: 2019-06-22 | Disposition: A | Discharge status: Home / Self Care | Payer: Medicaid Other | Attending: Family Medicine | Admitting: Family Medicine

## 2019-06-22 ENCOUNTER — Other Ambulatory Visit: Payer: Self-pay

## 2019-06-22 DIAGNOSIS — N3090 Cystitis, unspecified without hematuria: Secondary | ICD-10-CM | POA: Insufficient documentation

## 2019-06-22 LAB — CBC WITH DIFFERENTIAL/PLATELET
Abs Immature Granulocytes: 0.02 10*3/uL (ref 0.00–0.07)
Basophils Absolute: 0 10*3/uL (ref 0.0–0.1)
Basophils Relative: 1 %
Eosinophils Absolute: 0.2 10*3/uL (ref 0.0–0.5)
Eosinophils Relative: 3 %
HCT: 39.1 % (ref 36.0–46.0)
Hemoglobin: 12.7 g/dL (ref 12.0–15.0)
Immature Granulocytes: 0 %
Lymphocytes Relative: 38 %
Lymphs Abs: 2.2 10*3/uL (ref 0.7–4.0)
MCH: 27.6 pg (ref 26.0–34.0)
MCHC: 32.5 g/dL (ref 30.0–36.0)
MCV: 85 fL (ref 80.0–100.0)
Monocytes Absolute: 0.5 10*3/uL (ref 0.1–1.0)
Monocytes Relative: 8 %
Neutro Abs: 2.9 10*3/uL (ref 1.7–7.7)
Neutrophils Relative %: 50 %
Platelets: 247 10*3/uL (ref 150–400)
RBC: 4.6 MIL/uL (ref 3.87–5.11)
RDW: 14.3 % (ref 11.5–15.5)
WBC: 5.7 10*3/uL (ref 4.0–10.5)
nRBC: 0 % (ref 0.0–0.2)

## 2019-06-22 LAB — POCT URINALYSIS DIP (DEVICE)
Bilirubin Urine: NEGATIVE
Glucose, UA: NEGATIVE mg/dL
Hgb urine dipstick: NEGATIVE
Ketones, ur: NEGATIVE mg/dL
Leukocytes,Ua: NEGATIVE
Nitrite: POSITIVE — AB
Protein, ur: NEGATIVE mg/dL
Specific Gravity, Urine: >=1.03 (ref 1.005–1.030)
Urobilinogen, UA: 0.2 mg/dL (ref 0.0–1.0)
pH: 5.5 (ref 5.0–8.0)

## 2019-06-22 MED ORDER — CEPHALEXIN 500 MG PO CAPS: 500.0000 mg | ORAL_CAPSULE | Freq: Two times a day (BID) | ORAL | 0 refills | Status: DC

## 2019-06-22 NOTE — ED Triage Notes (Signed)
Pt presents to UC with urinary frequency, light yellow vaginal discharged, lower abdominal pain x 2 weeks; left sided lower back pain x 1 day.

## 2019-06-22 NOTE — ED Provider Notes (Signed)
[facility]   [id number] 06/22/19 Arrival Time: 1302  ASSESSMENT & PLAN:  1. Cystitis     H/O pyelonephritis.  Begin: Meds ordered this encounter  Medications  . cephALEXin (KEFLEX) 500 MG capsule    Sig: Take 1 capsule (500 mg total) by mouth 2 (two) times daily.    Dispense:  14 capsule    Refill:  0   Urine culture sent. Benign abdominal exam. No indications for urgent abdominal/pelvic imaging at this time. Discussed.   Follow-up Information    [facility] EMERGENCY DEPARTMENT.   Specialty: Emergency Medicine Why: If symptoms worsen in any way. Contact information: [address] [phone] mc [place] [address] [phone]          Reviewed expectations re: course of current medical issues. Questions answered. Outlined signs and symptoms indicating need for more acute intervention. Patient verbalized understanding. After Visit Summary given.   SUBJECTIVE: History from: patient. [name] is a 39 y.o. female who presents with complaint of intermittent left flank pain and lower abdominal pain. First noticed a couple of weeks ago; h/o pyelonephritis with similar symptoms. Questions if she has a UTI now. Does report urinary frequency; occasional dysuria. No fever reported. Ambulatory without difficulty. Normal PO intake without n/v. No specific aggravating or alleviating factors reported. OTC treatment: none reported.  Patient's last menstrual period was 06/11/2019 (exact date).   Past Surgical History:  Procedure Laterality Date  . abscess surgery    . TONSILLECTOMY    . TUBAL LIGATION       OBJECTIVE:  Vitals:   06/22/19 1316  BP: 128/73  Pulse: (!) 56  Resp: 18  Temp: 99.1 F (37.3 C)  TempSrc: Oral  SpO2: 100%    Clinical Course as of Jun 21 1325  Tue Jun 22, 2019  1326 Consistent with prior visits.  Pulse Rate(!): 56 [BH]    Clinical Course User Index [BH] Mardella Layman, MD    General appearance: alert, oriented, no acute distress HEENT: [place]; AT; oropharynx moist Lungs: unlabored respirations Abdomen: soft; without distention; mild  and poorly localized tenderness to palpation over lower left abdomen;without masses or organomegaly; without guarding or rebound tenderness Back: with reported L-sided CVA tenderness; FROM at waist Extremities: without LE edema; symmetrical; without gross deformities Skin: warm and dry Neurologic: normal gait Psychological: alert and cooperative; normal mood and affect  Labs: Results for orders placed or performed during the hospital encounter of 06/22/19  CBC with Differential  Result Value Ref Range   WBC 5.7 4.0 - 10.5 K/uL   RBC 4.60 3.87 - 5.11 MIL/uL   Hemoglobin 12.7 12.0 - 15.0 g/dL   HCT 99.3 71.6 - 96.7 %   MCV 85.0 80.0 - 100.0 fL   MCH 27.6 26.0 - 34.0 pg   MCHC 32.5 30.0 - 36.0 g/dL   RDW 89.3 81.0 - 17.5 %   Platelets 247 150 - 400 K/uL   nRBC 0.0 0.0 - 0.2 %   Neutrophils Relative % 50 %   Neutro Abs 2.9 1.7 - 7.7 K/uL   Lymphocytes Relative 38 %   Lymphs Abs 2.2 0.7 - 4.0 K/uL   Monocytes Relative 8 %   Monocytes Absolute 0.5 0.1 - 1.0 K/uL   Eosinophils Relative 3 %   Eosinophils Absolute 0.2 0.0 - 0.5 K/uL   Basophils Relative 1 %   Basophils Absolute 0.0 0.0 - 0.1 K/uL   Immature Granulocytes 0 %   Abs Immature  Granulocytes 0.02 0.00 - 0.07 K/uL  POCT urinalysis dip (device)  Result Value Ref Range   Glucose, UA NEGATIVE NEGATIVE mg/dL   Bilirubin Urine NEGATIVE NEGATIVE   Ketones, ur NEGATIVE NEGATIVE mg/dL   Specific Gravity, Urine >=1.030 1.005 - 1.030   Hgb urine dipstick NEGATIVE NEGATIVE   pH 5.5 5.0 - 8.0   Protein, ur NEGATIVE NEGATIVE mg/dL   Urobilinogen, UA 0.2 0.0 - 1.0 mg/dL   Nitrite POSITIVE (A) NEGATIVE   Leukocytes,Ua NEGATIVE NEGATIVE     No Known Allergies                                             Past Medical History:  Diagnosis Date  . Asthma   . History of  opioid abuse ([place])    currently on Methadone   . Insomnia   . Seizures ([place])     Social History   Socioeconomic History  . Marital status: Significant Other    Spouse name: Not on file  . Number of children: 3  . Years of education: 9th  . Highest education level: Not on file  Occupational History  . Occupation: stay at home mother  Tobacco Use  . Smoking status: Current Every Day Smoker    Packs/day: 0.50    Types: Cigarettes  . Smokeless tobacco: Never Used  Substance and Sexual Activity  . Alcohol use: No  . Drug use: No  . Sexual activity: Not Currently    Birth control/protection: Surgical  Other Topics Concern  . Not on file  Social History Narrative   Lives at home with her mother and three children.   Right-handed.   Two 20oz bottles of [employer] daily.   Social Determinants of Health   Financial Resource Strain:   . Difficulty of Paying Living Expenses: Not on file  Food Insecurity:   . Worried About Charity fundraiser in the Last Year: Not on file  . Ran Out of Food in the Last Year: Not on file  Transportation Needs:   . Lack of Transportation (Medical): Not on file  . Lack of Transportation (Non-Medical): Not on file  Physical Activity:   . Days of Exercise per Week: Not on file  . Minutes of Exercise per Session: Not on file  Stress:   . Feeling of Stress : Not on file  Social Connections:   . Frequency of Communication with Friends and Family: Not on file  . Frequency of Social Gatherings with Friends and Family: Not on file  . Attends Religious Services: Not on file  . Active Member of Clubs or Organizations: Not on file  . Attends Archivist Meetings: Not on file  . Marital Status: Not on file  Intimate Partner Violence:   . Fear of Current or Ex-Partner: Not on file  . Emotionally Abused: Not on file  . Physically Abused: Not on file  . Sexually Abused: Not on file    Family History  Problem Relation Age of Onset  .  Hypertension Mother   . Other Father        fatty liver, reports he had a seizure when he died     Vanessa Kick, MD 07-Jul-2019 1427

## 2019-06-24 LAB — URINE CULTURE: Culture: >=100000 — AB
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
## 2019-07-07 ENCOUNTER — Encounter: Payer: Self-pay | Admitting: Neurology

## 2019-07-07 ENCOUNTER — Ambulatory Visit: Payer: Medicaid Other | Admitting: Neurology

## 2019-10-02 ENCOUNTER — Other Ambulatory Visit: Payer: Self-pay | Admitting: Neurology

## 2019-11-01 ENCOUNTER — Other Ambulatory Visit: Payer: Self-pay | Admitting: Neurology

## 2019-11-15 ENCOUNTER — Encounter: Payer: Self-pay | Admitting: Neurology

## 2019-11-15 ENCOUNTER — Ambulatory Visit: Payer: Medicaid Other | Admitting: Neurology
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
## 2019-11-24 ENCOUNTER — Other Ambulatory Visit: Payer: Self-pay | Admitting: Neurology

## 2019-11-25 ENCOUNTER — Other Ambulatory Visit: Payer: Self-pay | Admitting: Neurology

## 2019-12-03 ENCOUNTER — Other Ambulatory Visit: Payer: Self-pay | Admitting: Neurology

## 2019-12-20 ENCOUNTER — Other Ambulatory Visit: Payer: Self-pay | Admitting: Neurology

## 2019-12-23 ENCOUNTER — Ambulatory Visit: Payer: Medicaid Other | Admitting: Neurology

## 2019-12-23 ENCOUNTER — Encounter: Payer: Self-pay | Admitting: Neurology

## 2019-12-23 VITALS — BP 117/67 | HR 53 | Ht 66.0 in | Wt 188.0 lb

## 2019-12-23 DIAGNOSIS — R569 Unspecified convulsions: Secondary | ICD-10-CM

## 2019-12-23 MED ORDER — LEVETIRACETAM ER 500 MG PO TB24: 1500.0000 mg | ORAL_TABLET | Freq: Every day | ORAL | 3 refills | Status: DC
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
## 2019-12-23 NOTE — Patient Instructions (Addendum)
Check blood work today  Increase Keppra to 1500 mg at bedtime, 3 tablets at bedtime If seizures continue, let me know, will repeat EEG No driving until seizure free for 6 months See you back in 5 months  Please send progress report of status

## 2019-12-27 ENCOUNTER — Telehealth: Payer: Self-pay | Admitting: Neurology

## 2019-12-27 LAB — CBC WITH DIFFERENTIAL/PLATELET
Basophils Absolute: 0 10*3/uL (ref 0.0–0.2)
Basos: 1 %
EOS (ABSOLUTE): 0.1 10*3/uL (ref 0.0–0.4)
Eos: 2 %
Hematocrit: 35 % (ref 34.0–46.6)
Hemoglobin: 11.2 g/dL (ref 11.1–15.9)
Immature Grans (Abs): 0 10*3/uL (ref 0.0–0.1)
Immature Granulocytes: 0 %
Lymphocytes Absolute: 2.3 10*3/uL (ref 0.7–3.1)
Lymphs: 33 %
MCH: 25.9 pg — ABNORMAL LOW (ref 26.6–33.0)
MCHC: 32 g/dL (ref 31.5–35.7)
MCV: 81 fL (ref 79–97)
Monocytes Absolute: 0.8 10*3/uL (ref 0.1–0.9)
Monocytes: 11 %
Neutrophils Absolute: 3.7 10*3/uL (ref 1.4–7.0)
Neutrophils: 53 %
Platelets: 256 10*3/uL (ref 150–450)
RBC: 4.32 x10E6/uL (ref 3.77–5.28)
RDW: 14.4 % (ref 11.7–15.4)
WBC: 6.9 10*3/uL (ref 3.4–10.8)

## 2019-12-27 LAB — COMPREHENSIVE METABOLIC PANEL
ALT: 19 IU/L (ref 0–32)
AST: 22 IU/L (ref 0–40)
Albumin/Globulin Ratio: 1.5 (ref 1.2–2.2)
Albumin: 4.2 g/dL (ref 3.8–4.8)
Alkaline Phosphatase: 54 IU/L (ref 48–121)
BUN/Creatinine Ratio: 11 (ref 9–23)
BUN: 9 mg/dL (ref 6–20)
Bilirubin Total: 0.2 mg/dL (ref 0.0–1.2)
CO2: 27 mmol/L (ref 20–29)
Calcium: 9.5 mg/dL (ref 8.7–10.2)
Chloride: 103 mmol/L (ref 96–106)
Creatinine, Ser: 0.85 mg/dL (ref 0.57–1.00)
GFR calc Af Amer: 101 mL/min/{1.73_m2} (ref >59)
GFR calc non Af Amer: 88 mL/min/{1.73_m2} (ref >59)
Globulin, Total: 2.8 g/dL (ref 1.5–4.5)
Glucose: 83 mg/dL (ref 65–99)
Potassium: 4.3 mmol/L (ref 3.5–5.2)
Sodium: 141 mmol/L (ref 134–144)
Total Protein: 7 g/dL (ref 6.0–8.5)

## 2019-12-27 LAB — LEVETIRACETAM LEVEL: Levetiracetam Lvl: <1 ug/mL — ABNORMAL LOW (ref 10.0–40.0)

## 2019-12-27 NOTE — Telephone Encounter (Signed)
I called [name] and LMVM for her to call back tomorrow for lab results.

## 2019-12-27 NOTE — Telephone Encounter (Signed)
I recently saw the patient 8/26 for seizures, has reported episodes of staring off, suspicious for partial seizure, most recent was 1 month ago.  She reports she was compliant with Keppra, has not missed any doses, as result, we increased her dosing at last visit.  Laboratory evaluation has returned, indicates Keppra level was undetectable, < 1.0.  Please make sure she is taking medication as prescribed.  Otherwise, labs show no significant abnormality.

## 2019-12-28 NOTE — Telephone Encounter (Addendum)
I called pt and LMVM for her on her [phone], second call.  Relayed her lab results no detectable keppra level, other lab results no significant abnormality.  Pt reminded to take her keppra medication as prescribed. Pt is to call back if questions.

## 2020-03-01 NOTE — Progress Notes (Signed)
I have reviewed and agreed above plan. 

## 2020-03-13 ENCOUNTER — Encounter (HOSPITAL_COMMUNITY): Payer: Self-pay | Admitting: *Deleted

## 2020-03-13 ENCOUNTER — Ambulatory Visit (HOSPITAL_COMMUNITY)
Admission: EM | Admit: 2020-03-13 | Discharge: 2020-03-13 | Disposition: A | Discharge status: Home / Self Care | Payer: Medicaid Other | Attending: Urgent Care | Admitting: Urgent Care

## 2020-03-13 ENCOUNTER — Other Ambulatory Visit: Payer: Self-pay

## 2020-03-13 DIAGNOSIS — R300 Dysuria: Secondary | ICD-10-CM | POA: Diagnosis present

## 2020-03-13 DIAGNOSIS — N3001 Acute cystitis with hematuria: Secondary | ICD-10-CM | POA: Diagnosis not present

## 2020-03-13 DIAGNOSIS — R109 Unspecified abdominal pain: Secondary | ICD-10-CM

## 2020-03-13 LAB — POCT URINALYSIS DIPSTICK, ED / UC
Bilirubin Urine: NEGATIVE
Glucose, UA: NEGATIVE mg/dL
Hgb urine dipstick: NEGATIVE
Ketones, ur: NEGATIVE mg/dL
Nitrite: POSITIVE — AB
Protein, ur: NEGATIVE mg/dL
Specific Gravity, Urine: >=1.03 (ref 1.005–1.030)
Urobilinogen, UA: 0.2 mg/dL (ref 0.0–1.0)
pH: 5.5 (ref 5.0–8.0)

## 2020-03-13 MED ORDER — SULFAMETHOXAZOLE-TRIMETHOPRIM 800-160 MG PO TABS: 1.0000 | ORAL_TABLET | Freq: Two times a day (BID) | ORAL | 0 refills | Status: DC

## 2020-03-13 NOTE — ED Notes (Signed)
Please check temp when in room ,because Pt was drinking a cold drink in lobby.

## 2020-03-13 NOTE — ED Triage Notes (Signed)
Pt reports 3 weeks ago UTI sx's started. Pt reports odor to urine ,frquency and RT flank pain.

## 2020-03-13 NOTE — Discharge Instructions (Addendum)
Make sure you hydrate very well with plain water and a quantity of 64 ounces of water a day.  Please limit drinks that are considered urinary irritants such as soda, sweet tea, coffee, energy drinks, alcohol.  These can worsen your UTI symptoms and also be the source of them.  I will let you know about your urine culture results through MyChart to see if we need to change your antibiotics based off of those results. ° °

## 2020-03-13 NOTE — ED Provider Notes (Signed)
[facility]   MRN: [id number] DOB: Sep 13, 1980  Subjective:   [name] is a 39 y.o. female presenting for 3-week history of persistent intermittent and worsening dysuria, cloudy urine, urinary frequency, right flank pain.  Patient has a history of UTIs, pyelonephritis.  Admits that she does not drink water.  She mostly drinks coffee and [facility].  Denies fever, nausea, vomiting, hematuria, abdominal pelvic pain.  No current facility-administered medications for this encounter.  Current Outpatient Medications:  .  levETIRAcetam (KEPPRA XR) 500 MG 24 hr tablet, Take 3 tablets (1,500 mg total) by mouth at bedtime., Disp: 270 tablet, Rfl: 3 .  METHADONE HCL PO, Take 132 mg by mouth daily., Disp: , Rfl:  .  zolpidem (AMBIEN) 10 MG tablet, Take 10 mg by mouth daily as needed (sleep)., Disp: , Rfl:    No Known Allergies  Past Medical History:  Diagnosis Date  . Asthma   . History of opioid abuse (HCC)    currently on Methadone   . Insomnia   . Seizures (HCC)      Past Surgical History:  Procedure Laterality Date  . abscess surgery    . TONSILLECTOMY    . TUBAL LIGATION      Family History  Problem Relation Age of Onset  . Hypertension Mother   . Other Father        fatty liver, reports he had a seizure when he died    Social History   Tobacco Use  . Smoking status: Current Every Day Smoker    Packs/day: 0.50    Types: Cigarettes  . Smokeless tobacco: Never Used  Substance Use Topics  . Alcohol use: No  . Drug use: No    ROS   Objective:   Vitals: BP (!) 122/57 (BP Location: Right Arm)   Pulse 60   Resp 16   Ht 5\' 6"  (1.676 m)   Wt 187 lb (84.8 kg)   LMP 02/28/2020   SpO2 100%   BMI 30.18 kg/m   Physical Exam Constitutional:      General: She is not in acute distress.    Appearance: Normal appearance. She is well-developed and normal weight. She is not ill-appearing, toxic-appearing or diaphoretic.  HENT:     Head:  Normocephalic and atraumatic.     Right Ear: External ear normal.     Left Ear: External ear normal.     Nose: Nose normal.     Mouth/Throat:     Mouth: Mucous membranes are moist.     Pharynx: Oropharynx is clear.  Eyes:     General: No scleral icterus.    Extraocular Movements: Extraocular movements intact.     Pupils: Pupils are equal, round, and reactive to light.  Cardiovascular:     Rate and Rhythm: Normal rate and regular rhythm.     Heart sounds: Normal heart sounds. No murmur heard.  No friction rub. No gallop.   Pulmonary:     Effort: Pulmonary effort is normal. No respiratory distress.     Breath sounds: Normal breath sounds. No stridor. No wheezing, rhonchi or rales.  Abdominal:     General: Bowel sounds are normal. There is no distension.     Palpations: Abdomen is soft. There is no mass.     Tenderness: There is no abdominal tenderness. There is no right CVA tenderness, left CVA tenderness, guarding or rebound.  Skin:    General: Skin is warm and dry.  Coloration: Skin is not pale.     Findings: No rash.  Neurological:     General: No focal deficit present.     Mental Status: She is alert and oriented to person, place, and time.  Psychiatric:        Mood and Affect: Mood normal.        Behavior: Behavior normal.        Thought Content: Thought content normal.        Judgment: Judgment normal.     Results for orders placed or performed during the hospital encounter of 03/13/20 (from the past 24 hour(s))  POC Urinalysis dipstick     Status: Abnormal   Collection Time: 03/13/20  9:47 AM  Result Value Ref Range   Glucose, UA NEGATIVE NEGATIVE mg/dL   Bilirubin Urine NEGATIVE NEGATIVE   Ketones, ur NEGATIVE NEGATIVE mg/dL   Specific Gravity, Urine >=1.030 1.005 - 1.030   Hgb urine dipstick NEGATIVE NEGATIVE   pH 5.5 5.0 - 8.0   Protein, ur NEGATIVE NEGATIVE mg/dL   Urobilinogen, UA 0.2 0.0 - 1.0 mg/dL   Nitrite POSITIVE (A) NEGATIVE   Leukocytes,Ua TRACE  (A) NEGATIVE    Assessment and Plan :   PDMP not reviewed this encounter.  1. Acute cystitis with hematuria   2. Flank pain   3. Dysuria     Start Bactrim to cover for acute cystitis, urine culture pending.  Recommended aggressive hydration, limiting urinary irritants. Counseled patient on potential for adverse effects with medications prescribed/recommended today, ER and return-to-clinic precautions discussed, patient verbalized understanding.    Wallis Bamberg, PA-C 03/13/20 1017

## 2020-03-15 LAB — URINE CULTURE: Culture: >=100000 — AB

## 2020-05-25 ENCOUNTER — Ambulatory Visit: Payer: Medicaid Other | Admitting: Neurology

## 2020-07-17 ENCOUNTER — Encounter (HOSPITAL_COMMUNITY): Payer: Self-pay

## 2020-07-17 ENCOUNTER — Telehealth (HOSPITAL_COMMUNITY): Payer: Self-pay | Admitting: Emergency Medicine

## 2020-07-17 ENCOUNTER — Other Ambulatory Visit: Payer: Self-pay

## 2020-07-17 ENCOUNTER — Ambulatory Visit (HOSPITAL_COMMUNITY)
Admission: EM | Admit: 2020-07-17 | Discharge: 2020-07-17 | Disposition: A | Discharge status: Home / Self Care | Payer: Medicaid Other | Attending: Emergency Medicine | Admitting: Emergency Medicine

## 2020-07-17 DIAGNOSIS — N12 Tubulo-interstitial nephritis, not specified as acute or chronic: Secondary | ICD-10-CM | POA: Insufficient documentation

## 2020-07-17 LAB — POCT URINALYSIS DIPSTICK, ED / UC
Bilirubin Urine: NEGATIVE
Glucose, UA: NEGATIVE mg/dL
Leukocytes,Ua: NEGATIVE
Nitrite: POSITIVE — AB
Protein, ur: NEGATIVE mg/dL
Specific Gravity, Urine: >=1.03 (ref 1.005–1.030)
Urobilinogen, UA: 0.2 mg/dL (ref 0.0–1.0)
pH: 5.5 (ref 5.0–8.0)

## 2020-07-17 LAB — POC URINE PREG, ED: Preg Test, Ur: NEGATIVE

## 2020-07-17 MED ORDER — SULFAMETHOXAZOLE-TRIMETHOPRIM 800-160 MG PO TABS: 1.0000 | ORAL_TABLET | Freq: Two times a day (BID) | ORAL | 0 refills | Status: AC

## 2020-07-17 MED ORDER — CEFPODOXIME PROXETIL 200 MG PO TABS: 200.0000 mg | ORAL_TABLET | Freq: Two times a day (BID) | ORAL | 0 refills | Status: AC

## 2020-07-17 NOTE — Discharge Instructions (Signed)
Drink plenty of water to empty bladder regularly. Avoid alcohol and caffeine as these may irritate the bladder.  Complete course of antibiotics.  If symptoms worsen or do not improve in the next week to return to be seen or to follow up with your PCP.   

## 2020-07-17 NOTE — ED Provider Notes (Signed)
[facility]    CSN: [id number] Arrival date & time: 07/17/20  0941      History   Chief Complaint Chief Complaint  Patient presents with  . Flank Pain  . Abdominal Pain  . Hematuria    HPI [name] is a 40 y.o. female.   [name] presents with complaints of  Right flank pain, urinary frequency, dysuria, and hematuria. This has been ongoing for two weeks now. No vaginal symptoms. No known fevers. No gi symptoms. States she gets frequent uti's. Has been taking azo which only helps with some of her symptoms.  Doesn't have a PCP.  Pan sensitive e coli on culture with last visit in November here .    ROS per HPI, negative if not otherwise mentioned.      Past Medical History:  Diagnosis Date  . Asthma   . History of opioid abuse (HCC)    currently on Methadone   . Insomnia   . Seizures [facility])     Patient Active Problem List   Diagnosis Date Noted  . Seizures (HCC) 03/16/2018  . Seizure (HCC) 02/28/2018  . Pyelonephritis 07/28/2017  . Asthma, moderate persistent, poorly-controlled 07/24/2015  . Routine health maintenance 07/24/2015  . Pain, dental 12/25/2012    Past Surgical History:  Procedure Laterality Date  . abscess surgery    . TONSILLECTOMY    . TUBAL LIGATION      OB History   No obstetric history on file.      Home Medications    Prior to Admission medications   Medication Sig Start Date End Date Taking? Authorizing Provider  cefpodoxime (VANTIN) 200 MG tablet Take 1 tablet (200 mg total) by mouth 2 (two) times daily for 10 days. 07/17/20 07/27/20 Yes Keyan Folson, Barron Alvine, NP  levETIRAcetam (KEPPRA XR) 500 MG 24 hr tablet Take 3 tablets (1,500 mg total) by mouth at bedtime. 12/23/19   Glean Salvo, NP  METHADONE HCL PO Take 132 mg by mouth daily.    [provider]  sulfamethoxazole-trimethoprim (BACTRIM DS) 800-160 MG tablet Take 1 tablet by mouth 2 (two) times daily. 03/13/20   Wallis Bamberg, PA-C  zolpidem  (AMBIEN) 10 MG tablet Take 10 mg by mouth daily as needed (sleep).    [provider]    Family History Family History  Problem Relation Age of Onset  . Hypertension Mother   . Other Father        fatty liver, reports he had a seizure when he died    Social History Social History   Tobacco Use  . Smoking status: Current Every Day Smoker    Packs/day: 0.50    Types: Cigarettes  . Smokeless tobacco: Never Used  Substance Use Topics  . Alcohol use: No  . Drug use: No     Allergies   Patient has no known allergies.   Review of Systems Review of Systems   Physical Exam Triage Vital Signs ED Triage Vitals  Enc Vitals Group     BP 07/17/20 1004 122/74     Pulse Rate 07/17/20 1004 (!) 105     Resp 07/17/20 1004 19     Temp 07/17/20 1004 98.9 F (37.2 C)     Temp src --      SpO2 07/17/20 1004 97 %     Weight --      Height --      Head Circumference --      Peak Flow --  Pain Score 07/17/20 1002 8     Pain Loc --      Pain Edu? --      Excl. in GC? --    No data found.  Updated Vital Signs BP 122/74   Pulse (!) 105   Temp 98.9 F (37.2 C)   Resp 19   LMP 07/15/2020 (Approximate)   SpO2 97%   Visual Acuity Right Eye Distance:   Left Eye Distance:   Bilateral Distance:    Right Eye Near:   Left Eye Near:    Bilateral Near:     Physical Exam Constitutional:      General: She is not in acute distress.    Appearance: She is well-developed.  Cardiovascular:     Rate and Rhythm: Tachycardia present.  Pulmonary:     Effort: Pulmonary effort is normal.  Abdominal:     Tenderness: There is right CVA tenderness.  Skin:    General: Skin is warm and dry.  Neurological:     Mental Status: She is alert and oriented to person, place, and time.      UC Treatments / Results  Labs (all labs ordered are listed, but only abnormal results are displayed) Labs Reviewed  POCT URINALYSIS DIPSTICK, ED / UC - Abnormal; Notable for the following  components:      Result Value   Ketones, ur TRACE (*)    Hgb urine dipstick MODERATE (*)    Nitrite POSITIVE (*)    All other components within normal limits  URINE CULTURE  POC URINE PREG, ED    EKG   Radiology No results found.  Procedures Procedures (including critical care time)  Medications Ordered in UC Medications - No data to display  Initial Impression / Assessment and Plan / UC Course  I have reviewed the triage vital signs and the nursing notes.  Pertinent labs & imaging results that were available during my care of the patient were reviewed by me and considered in my medical decision making (see chart for details).     Nitrite positive with moderate hgb, cva tenderness and tachycardia. Pyelonephritis treatment initiated with culture pending. Return precautions provided. Patient verbalized understanding and agreeable to plan.   Final Clinical Impressions(s) / UC Diagnoses   Final diagnoses:  Pyelonephritis     Discharge Instructions     Drink plenty of water to empty bladder regularly. Avoid alcohol and caffeine as these may irritate the bladder.   Complete course of antibiotics.  If symptoms worsen or do not improve in the next week to return to be seen or to follow up with your PCP.      ED Prescriptions    Medication Sig Dispense Auth. Provider   cefpodoxime (VANTIN) 200 MG tablet Take 1 tablet (200 mg total) by mouth 2 (two) times daily for 10 days. 20 tablet Georgetta Haber, NP     PDMP not reviewed this encounter.   Georgetta Haber, NP 07/17/20 1054

## 2020-07-17 NOTE — ED Triage Notes (Signed)
Pt in with c/o lower abdominal pain and right flank pain that has been going on for a few days  Pt has tried AZO for sxs relief

## 2020-07-19 LAB — URINE CULTURE: Culture: >=100000 — AB

## 2020-09-12 ENCOUNTER — Other Ambulatory Visit: Payer: Self-pay | Admitting: Neurology

## 2020-11-06 ENCOUNTER — Other Ambulatory Visit: Payer: Self-pay | Admitting: Neurology

## 2021-02-05 ENCOUNTER — Ambulatory Visit: Payer: Medicaid Other | Admitting: Neurology

## 2021-04-17 ENCOUNTER — Encounter (HOSPITAL_COMMUNITY): Payer: Self-pay

## 2021-04-17 ENCOUNTER — Ambulatory Visit (HOSPITAL_COMMUNITY)
Admission: EM | Admit: 2021-04-17 | Discharge: 2021-04-17 | Disposition: A | Discharge status: Home / Self Care | Payer: Medicaid Other | Attending: Family Medicine | Admitting: Family Medicine

## 2021-04-17 ENCOUNTER — Other Ambulatory Visit: Payer: Self-pay

## 2021-04-17 DIAGNOSIS — N12 Tubulo-interstitial nephritis, not specified as acute or chronic: Secondary | ICD-10-CM | POA: Insufficient documentation

## 2021-04-17 DIAGNOSIS — N926 Irregular menstruation, unspecified: Secondary | ICD-10-CM | POA: Diagnosis not present

## 2021-04-17 LAB — POCT URINALYSIS DIPSTICK, ED / UC
Glucose, UA: NEGATIVE mg/dL
Leukocytes,Ua: NEGATIVE
Nitrite: POSITIVE — AB
Protein, ur: NEGATIVE mg/dL
Specific Gravity, Urine: >=1.03 (ref 1.005–1.030)
Urobilinogen, UA: 0.2 mg/dL (ref 0.0–1.0)
pH: 6 (ref 5.0–8.0)

## 2021-04-17 MED ORDER — CIPROFLOXACIN HCL 500 MG PO TABS: 500.0000 mg | ORAL_TABLET | Freq: Two times a day (BID) | ORAL | 0 refills | Status: AC

## 2021-04-17 NOTE — ED Provider Notes (Signed)
[facility]    CSN: [id number] Arrival date & time: 04/17/21  1021      History   Chief Complaint Chief Complaint  Patient presents with   Recurrent UTI    HPI [name] is a 40 y.o. female.   HPI here with a 1 week history of malodorous urine, urinary frequency, and right flank pain.  No fever or chills at home.  No nausea vomiting or diarrhea.  No URI symptoms.  She has also had frequent menstrual cycles in the last 2 months she has had 4 periods that have lasted 2 to 3 days in the last 2 months.  This last 1 has been a little heavier.  It just started a day or so ago.  She has had a tubal  Past Medical History:  Diagnosis Date   Asthma    History of opioid abuse (HCC)    currently on Methadone    Insomnia    Seizures (HCC)     Patient Active Problem List   Diagnosis Date Noted   Seizures (HCC) 03/16/2018   Seizure (HCC) 02/28/2018   Pyelonephritis 07/28/2017   Asthma, moderate persistent, poorly-controlled 07/24/2015   Routine health maintenance 07/24/2015   Pain, dental 12/25/2012    Past Surgical History:  Procedure Laterality Date   abscess surgery     TONSILLECTOMY     TUBAL LIGATION      OB History   No obstetric history on file.      Home Medications    Prior to Admission medications   Medication Sig Start Date End Date Taking? Authorizing Provider  ciprofloxacin (CIPRO) 500 MG tablet Take 1 tablet (500 mg total) by mouth 2 (two) times daily for 10 days. 04/17/21 04/27/21 Yes BanisterJanace Aris, MD  METHADONE HCL PO Take 132 mg by mouth daily.    [provider]  zolpidem (AMBIEN) 10 MG tablet Take 10 mg by mouth daily as needed (sleep).    [provider]    Family History Family History  Problem Relation Age of Onset   Hypertension Mother    Other Father        fatty liver, reports he had a seizure when he died    Social History Social History   Tobacco Use   Smoking status: Every Day     Packs/day: 0.50    Types: Cigarettes   Smokeless tobacco: Never  Substance Use Topics   Alcohol use: No   Drug use: No     Allergies   Patient has no known allergies.   Review of Systems Review of Systems   Physical Exam Triage Vital Signs ED Triage Vitals [04/17/21 1142]  Enc Vitals Group     BP (!) 145/69     Pulse Rate (!) 55     Resp 18     Temp 98.4 F (36.9 C)     Temp Source Oral     SpO2 99 %     Weight      Height      Head Circumference      Peak Flow      Pain Score 6     Pain Loc      Pain Edu?      Excl. in GC?    No data found.  Updated Vital Signs BP (!) 145/69 (BP Location: Right Arm)    Pulse (!) 55    Temp 98.4 F (36.9 C) (Oral)    Resp 18  LMP 04/16/2021    SpO2 99%   Visual Acuity Right Eye Distance:   Left Eye Distance:   Bilateral Distance:    Right Eye Near:   Left Eye Near:    Bilateral Near:     Physical Exam Vitals reviewed.  Constitutional:      General: She is not in acute distress.    Appearance: She is not toxic-appearing.  HENT:     Mouth/Throat:     Mouth: Mucous membranes are moist.     Pharynx: No oropharyngeal exudate.  Eyes:     Extraocular Movements: Extraocular movements intact.     Pupils: Pupils are equal, round, and reactive to light.  Cardiovascular:     Rate and Rhythm: Normal rate and regular rhythm.     Heart sounds: No murmur heard. Pulmonary:     Effort: Pulmonary effort is normal.     Breath sounds: Normal breath sounds.  Abdominal:     Palpations: Abdomen is soft. There is no mass.     Tenderness: There is no abdominal tenderness. There is right CVA tenderness (on percussion). There is no left CVA tenderness.  Musculoskeletal:     Cervical back: Neck supple.  Lymphadenopathy:     Cervical: No cervical adenopathy.  Skin:    Capillary Refill: Capillary refill takes less than 2 seconds.     Coloration: Skin is not jaundiced or pale.  Neurological:     General: No focal deficit present.      Mental Status: She is alert and oriented to person, place, and time.  Psychiatric:        Behavior: Behavior normal.     UC Treatments / Results  Labs (all labs ordered are listed, but only abnormal results are displayed) Labs Reviewed  POCT URINALYSIS DIPSTICK, ED / UC - Abnormal; Notable for the following components:      Result Value   Bilirubin Urine SMALL (*)    Ketones, ur TRACE (*)    Hgb urine dipstick LARGE (*)    Nitrite POSITIVE (*)    All other components within normal limits  URINE CULTURE    EKG   Radiology No results found.  Procedures Procedures (including critical care time)  Medications Ordered in UC Medications - No data to display  Initial Impression / Assessment and Plan / UC Course  I have reviewed the triage vital signs and the nursing notes.  Pertinent labs & imaging results that were available during my care of the patient were reviewed by me and considered in my medical decision making (see chart for details).     UA shows nitrites and blood. With her CVA tenderness, will tx for 10 days of cipro for poss pyelo. Final Clinical Impressions(s) / UC Diagnoses   Final diagnoses:  Pyelonephritis  Abnormal menses     Discharge Instructions      Your urinalysis was abnormal, and a culture will be sent of the urine. Our staff will call you if you need different treatment for the urine infection.  I sent in Cipro, and take it 1 tab twice daily for 10 days. Drink lots of fluids.  Get in with a general doctor to establish and evaluate your frequent periods.  Take a vitamin with iron in the short term     ED Prescriptions     Medication Sig Dispense Auth. Provider   ciprofloxacin (CIPRO) 500 MG tablet Take 1 tablet (500 mg total) by mouth 2 (two) times daily for 10 days.  20 tablet Mahlet Jergens, Janace Aris, MD      PDMP not reviewed this encounter.   Zenia Resides, MD 04/17/21 1213

## 2021-04-17 NOTE — ED Triage Notes (Signed)
Pt states has a constant UTI. States a week ago urine is dark with an odor. C/o rt flank pain. States has had 4 menstrual cycles in the past 2 months.

## 2021-04-17 NOTE — Discharge Instructions (Signed)
Your urinalysis was abnormal, and a culture will be sent of the urine. Our staff will call you if you need different treatment for the urine infection.  I sent in Cipro, and take it 1 tab twice daily for 10 days. Drink lots of fluids.  Get in with a general doctor to establish and evaluate your frequent periods.  Take a vitamin with iron in the short term

## 2021-04-19 LAB — URINE CULTURE: Culture: >=100000 — AB

## 2021-09-04 ENCOUNTER — Other Ambulatory Visit: Payer: Self-pay | Admitting: Neurology

## 2021-11-08 ENCOUNTER — Ambulatory Visit (HOSPITAL_COMMUNITY)
Admission: EM | Admit: 2021-11-08 | Discharge: 2021-11-08 | Disposition: A | Discharge status: Home / Self Care | Payer: Medicaid Other

## 2021-11-09 ENCOUNTER — Encounter (HOSPITAL_COMMUNITY): Payer: Self-pay

## 2021-11-09 ENCOUNTER — Ambulatory Visit (HOSPITAL_COMMUNITY)
Admission: EM | Admit: 2021-11-09 | Discharge: 2021-11-09 | Disposition: A | Discharge status: Home / Self Care | Payer: Medicaid Other | Attending: Emergency Medicine | Admitting: Emergency Medicine

## 2021-11-09 DIAGNOSIS — N12 Tubulo-interstitial nephritis, not specified as acute or chronic: Secondary | ICD-10-CM

## 2021-11-09 DIAGNOSIS — N3090 Cystitis, unspecified without hematuria: Secondary | ICD-10-CM

## 2021-11-09 LAB — POCT URINALYSIS DIPSTICK, ED / UC
Bilirubin Urine: NEGATIVE
Glucose, UA: NEGATIVE mg/dL
Ketones, ur: NEGATIVE mg/dL
Leukocytes,Ua: NEGATIVE
Nitrite: POSITIVE — AB
Protein, ur: NEGATIVE mg/dL
Specific Gravity, Urine: >=1.03 (ref 1.005–1.030)
Urobilinogen, UA: 0.2 mg/dL (ref 0.0–1.0)
pH: 5.5 (ref 5.0–8.0)

## 2021-11-09 LAB — POC URINE PREG, ED: Preg Test, Ur: NEGATIVE

## 2021-11-09 MED ORDER — SULFAMETHOXAZOLE-TRIMETHOPRIM 800-160 MG PO TABS: 1.0000 | ORAL_TABLET | Freq: Two times a day (BID) | ORAL | 0 refills | Status: AC

## 2021-11-09 MED ORDER — NITROFURANTOIN MONOHYD MACRO 100 MG PO CAPS: 100.0000 mg | ORAL_CAPSULE | Freq: Two times a day (BID) | ORAL | 0 refills | Status: DC

## 2021-11-09 MED ORDER — LIDOCAINE 5 % EX PTCH: 1.0000 | MEDICATED_PATCH | Freq: Two times a day (BID) | CUTANEOUS | 0 refills | Status: DC

## 2021-11-09 NOTE — Discharge Instructions (Addendum)
Please take medication as prescribed. I recommend to take with food.  Please go to the emergency department if symptoms worsen.

## 2021-11-09 NOTE — ED Triage Notes (Signed)
Pt reports dysuria for several days. She reports an ongoing history of Dysuria. She has appointment with her PCP on 07/28/202.Marland Kitchen

## 2021-11-09 NOTE — ED Provider Notes (Addendum)
[place]    CSN: [id number] Arrival date & time: 11/09/21  0920     History   Chief Complaint Chief Complaint  Patient presents with   Dysuria   Urinary Tract Infection    HPI [name] is a 41 y.o. female.  Presents with one week history of dysuria. Reports bladder pain and discomfort, right flank pain. Denies fevers. History of recurrent UTIs, hospitalized for pyelo in 2019. Has been taking azo twice daily for 3 days. Taking motrin for pain. Denies nausea, vomiting, diarrhea.  Reports menstrual cycle began 2 weeks early. Heavy bleeding and cramping.  Has new patient appointment on 7/26 with primary care.  Past Medical History:  Diagnosis Date   Asthma    History of opioid abuse ([place])    currently on Methadone    Insomnia    Seizures ([place])     Patient Active Problem List   Diagnosis Date Noted   Seizures ([place]) 03/16/2018   Seizure ([place]) 02/28/2018   Pyelonephritis 07/28/2017   Asthma, moderate persistent, poorly-controlled 07/24/2015   Routine health maintenance 07/24/2015   Pain, dental 12/25/2012    Past Surgical History:  Procedure Laterality Date   abscess surgery     TONSILLECTOMY     TUBAL LIGATION      OB History   No obstetric history on file.      Home Medications    Prior to Admission medications   Medication Sig Start Date End Date Taking? Authorizing Provider  sulfamethoxazole-trimethoprim (BACTRIM DS) 800-160 MG tablet Take 1 tablet by mouth 2 (two) times daily for 14 days. 11/09/21 11/23/21 Yes Arnell Slivinski, PA-C  lidocaine (LIDODERM) 5 % Place 1 patch onto the skin every 12 (twelve) hours. Remove & Discard patch within 12 hours or as directed by MD 11/09/21   Amayia Ciano, Wells Guiles, PA-C  METHADONE HCL PO Take 132 mg by mouth daily.    [provider]  zolpidem (AMBIEN) 10 MG tablet Take 10 mg by mouth daily as needed (sleep).    [provider]    Family History Family History  Problem Relation Age  of Onset   Hypertension Mother    Other Father        fatty liver, reports he had a seizure when he died    Social History Social History   Tobacco Use   Smoking status: Every Day    Packs/day: 0.50    Types: Cigarettes   Smokeless tobacco: Never  Substance Use Topics   Alcohol use: No   Drug use: No     Allergies   Patient has no known allergies.   Review of Systems Review of Systems  Genitourinary:  Positive for dysuria.   Per HPI  Physical Exam Triage Vital Signs ED Triage Vitals  Enc Vitals Group     BP 11/09/21 1054 (!) 150/81     Pulse Rate 11/09/21 1054 (!) 59     Resp 11/09/21 1052 16     Temp 11/09/21 1054 98.1 F (36.7 C)     Temp Source 11/09/21 1054 Oral     SpO2 11/09/21 1052 96 %     Weight --      Height --      Head Circumference --      Peak Flow --      Pain Score --      Pain Loc --      Pain Edu? --      Excl. in [place]? --  No data found.  Updated Vital Signs BP (!) 150/81 (BP Location: Left Arm)   Pulse (!) 59   Temp 98.1 F (36.7 C) (Oral)   Resp 18   SpO2 96%    Physical Exam Vitals and nursing note reviewed.  Constitutional:      General: She is not in acute distress. HENT:     Mouth/Throat:     Mouth: Mucous membranes are moist.     Pharynx: Oropharynx is clear.  Eyes:     Conjunctiva/sclera: Conjunctivae normal.  Cardiovascular:     Rate and Rhythm: Normal rate and regular rhythm.     Heart sounds: Normal heart sounds.  Pulmonary:     Effort: Pulmonary effort is normal.     Breath sounds: Normal breath sounds.  Abdominal:     General: Bowel sounds are normal.     Palpations: Abdomen is soft.     Tenderness: There is abdominal tenderness in the suprapubic area. There is right CVA tenderness. There is no left CVA tenderness, guarding or rebound.  Musculoskeletal:        General: Normal range of motion.     Comments: Tender to palpation right paraspinals in thoracic/lumbar  Neurological:     Mental Status: She  is alert and oriented to person, place, and time.     UC Treatments / Results  Labs (all labs ordered are listed, but only abnormal results are displayed) Labs Reviewed  POCT URINALYSIS DIPSTICK, ED / UC - Abnormal; Notable for the following components:      Result Value   Hgb urine dipstick MODERATE (*)    Nitrite POSITIVE (*)    All other components within normal limits  URINE CULTURE  POC URINE PREG, ED    EKG   Radiology No results found.  Procedures Procedures (including critical care time)  Medications Ordered in UC Medications - No data to display  Initial Impression / Assessment and Plan / UC Course  I have reviewed the triage vital signs and the nursing notes.  Pertinent labs & imaging results that were available during my care of the patient were reviewed by me and considered in my medical decision making (see chart for details).  Urinalysis positive for nitrates and hemoglobin.  Patient is currently on her menstrual cycle. Culture & sensitivity pending. Right CVA tenderness.  Afebrile in clinic, patient is overall well-appearing and I feel she can benefit from outpatient treatment at this time.  We will try Bactrim twice daily for 14 days for possible pyelonephritis. Lidocaine patches for back pain. Emergency department precautions, patient understands any worsening symptoms she needs to seek care.  Otherwise she can return to urgent care.  She can follow-up with her primary care provider on her new patient visit.  Agrees to plan and is discharged in stable condition.  Final Clinical Impressions(s) / UC Diagnoses   Final diagnoses:  Cystitis  Pyelonephritis     Discharge Instructions      Please take medication as prescribed. I recommend to take with food.  Please go to the emergency department if symptoms worsen.    ED Prescriptions     Medication Sig Dispense Auth. Provider               sulfamethoxazole-trimethoprim (BACTRIM DS) 800-160 MG  tablet Take 1 tablet by mouth 2 (two) times daily for 14 days. 28 tablet Jayra Choyce, PA-C   lidocaine (LIDODERM) 5 % Place 1 patch onto the skin every 12 (twelve) hours. Remove &  Discard patch within 12 hours or as directed by MD 30 patch Idan Prime, Lurena Joiner, PA-C      PDMP not reviewed this encounter.      Virgal [place], Lurena Joiner, Cordelia Poche 11/09/21 1204

## 2021-11-21 ENCOUNTER — Ambulatory Visit (INDEPENDENT_AMBULATORY_CARE_PROVIDER_SITE_OTHER): Payer: Medicaid Other | Admitting: Family Medicine

## 2021-11-21 ENCOUNTER — Encounter: Payer: Self-pay | Admitting: Family Medicine

## 2021-11-21 VITALS — BP 118/80 | HR 58 | Ht 66.0 in | Wt 187.0 lb

## 2021-11-21 DIAGNOSIS — Z87440 Personal history of urinary (tract) infections: Secondary | ICD-10-CM

## 2021-11-21 DIAGNOSIS — Z87898 Personal history of other specified conditions: Secondary | ICD-10-CM

## 2021-11-21 DIAGNOSIS — J4540 Moderate persistent asthma, uncomplicated: Secondary | ICD-10-CM

## 2021-11-21 DIAGNOSIS — N390 Urinary tract infection, site not specified: Secondary | ICD-10-CM

## 2021-11-21 DIAGNOSIS — Z720 Tobacco use: Secondary | ICD-10-CM | POA: Diagnosis not present

## 2021-11-21 DIAGNOSIS — Z7689 Persons encountering health services in other specified circumstances: Secondary | ICD-10-CM | POA: Diagnosis not present

## 2021-11-21 NOTE — Patient Instructions (Signed)
It was great seeing you today!  Today you came in to establish care. We are checking some blood work and I will call you if anything is abnormal or we will send a MyChart message if normal.  I have referred you to a neurologist specialist for the chronic UTIs and they will call you to schedule an appointment.  I have also provided you with a list of eye doctors  Please check-out at the front desk before leaving the clinic.  To see Dr. Raymondo Band for pulmonary function testing and smoking cessation counseling.  Schedule to see me in the next couple of weeks to do your Pap smear.  Feel free to call with any questions or concerns at any time, at [phone].   Take care,  Dr. Cora Collum [facility]
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
## 2021-11-22 DIAGNOSIS — Z87440 Personal history of urinary (tract) infections: Secondary | ICD-10-CM | POA: Insufficient documentation

## 2021-11-22 DIAGNOSIS — Z720 Tobacco use: Secondary | ICD-10-CM | POA: Insufficient documentation

## 2021-11-22 DIAGNOSIS — Z87898 Personal history of other specified conditions: Secondary | ICD-10-CM | POA: Insufficient documentation

## 2021-11-22 LAB — HEMOGLOBIN A1C
Est. average glucose Bld gHb Est-mCnc: 114 mg/dL
Hgb A1c MFr Bld: 5.6 % (ref 4.8–5.6)

## 2021-11-22 LAB — COMPREHENSIVE METABOLIC PANEL
ALT: 16 IU/L (ref 0–32)
AST: 19 IU/L (ref 0–40)
Albumin/Globulin Ratio: 2 (ref 1.2–2.2)
Albumin: 4.7 g/dL (ref 3.9–4.9)
Alkaline Phosphatase: 61 IU/L (ref 44–121)
BUN/Creatinine Ratio: 10 (ref 9–23)
BUN: 9 mg/dL (ref 6–20)
Bilirubin Total: <0.2 mg/dL (ref 0.0–1.2)
CO2: 22 mmol/L (ref 20–29)
Calcium: 9.7 mg/dL (ref 8.7–10.2)
Chloride: 102 mmol/L (ref 96–106)
Creatinine, Ser: 0.94 mg/dL (ref 0.57–1.00)
Globulin, Total: 2.4 g/dL (ref 1.5–4.5)
Glucose: 92 mg/dL (ref 70–99)
Potassium: 5.2 mmol/L (ref 3.5–5.2)
Sodium: 137 mmol/L (ref 134–144)
Total Protein: 7.1 g/dL (ref 6.0–8.5)
eGFR: 79 mL/min/{1.73_m2} (ref >59)

## 2021-11-22 LAB — LIPID PANEL
Chol/HDL Ratio: 3.6 ratio (ref 0.0–4.4)
Cholesterol, Total: 135 mg/dL (ref 100–199)
HDL: 38 mg/dL — ABNORMAL LOW (ref >39)
LDL Chol Calc (NIH): 80 mg/dL (ref 0–99)
Triglycerides: 86 mg/dL (ref 0–149)
VLDL Cholesterol Cal: 17 mg/dL (ref 5–40)

## 2021-11-22 LAB — CBC
Hematocrit: 38 % (ref 34.0–46.6)
Hemoglobin: 12.5 g/dL (ref 11.1–15.9)
MCH: 28 pg (ref 26.6–33.0)
MCHC: 32.9 g/dL (ref 31.5–35.7)
MCV: 85 fL (ref 79–97)
Platelets: 245 10*3/uL (ref 150–450)
RBC: 4.47 x10E6/uL (ref 3.77–5.28)
RDW: 14 % (ref 11.7–15.4)
WBC: 4.5 10*3/uL (ref 3.4–10.8)

## 2021-11-22 NOTE — Assessment & Plan Note (Signed)
Currently smokes 1/2 ppd. 10-pack-year history.  Does endorse wanting to quit. Discussed making an appointment with our pharmacy team for smoking cessation

## 2021-11-22 NOTE — Assessment & Plan Note (Signed)
Patient endorses having about 8-9 UTIs a year.  Currently on Bactrim, day 12 of 14.  Symptoms resolving.  Denies having any work-up for her chronic UTIs.  Discussed prophylactic care, which patient endorses doing but does not help prevent her UTIs.  Will refer to urology

## 2021-11-22 NOTE — Assessment & Plan Note (Signed)
Endorses current albuterol use about 2 times a week.  Not on a controller inhaler.  Patient will schedule with pharmacy team for pulmonary function testing in addition to smoking cessation discussed above.

## 2021-11-29 ENCOUNTER — Encounter: Payer: Self-pay | Admitting: Pharmacist

## 2021-11-29 ENCOUNTER — Ambulatory Visit (INDEPENDENT_AMBULATORY_CARE_PROVIDER_SITE_OTHER): Payer: Medicaid Other | Admitting: Pharmacist

## 2021-11-29 VITALS — BP 124/75 | HR 57

## 2021-11-29 DIAGNOSIS — Z720 Tobacco use: Secondary | ICD-10-CM | POA: Diagnosis not present

## 2021-11-29 DIAGNOSIS — J4540 Moderate persistent asthma, uncomplicated: Secondary | ICD-10-CM

## 2021-11-29 MED ORDER — VARENICLINE TARTRATE 0.5 MG PO TABS: 0.5000 mg | ORAL_TABLET | Freq: Two times a day (BID) | ORAL | 3 refills | Status: DC

## 2021-11-29 MED ORDER — BUDESONIDE-FORMOTEROL FUMARATE 80-4.5 MCG/ACT IN AERO: 2.0000 | INHALATION_SPRAY | Freq: Two times a day (BID) | RESPIRATORY_TRACT | 3 refills | Status: DC

## 2021-11-29 NOTE — Assessment & Plan Note (Signed)
Moderate persistent asthma of > 15 years duration , experiencing breathlessness and taking albuterol PRN only. Medication adherence good. Spirometry evaluation with pre- and post-bronchodilator reveals normal lung function. Spirometry evaluation reveals restriction.  Post nebulized albuterol tx revealed improvement of lung function and subjective symptom improvement with patient reporting breathing was 8/10.  -continue Albuterol 2 puffs Q6h PRN shortness of breath and begin symbicort (budesonide/formoterol) 80/4.5 2 puffs BID and PRN shortness of breath -Educated patient on purpose, proper use, potential adverse effects including risk of esophageal candidiasis and need to rinse mouth after each use.  -Reviewed results of pulmonary function tests. Patient verbalized understanding of treatment plan.

## 2021-11-29 NOTE — Assessment & Plan Note (Signed)
  Tobacco Abuse - longstanding with 20+ pack year history interested and motivate to attempt cessation.   - Initiate Chantix 0.5mg  daily for 3 days, then 0.5mg  BID - Set quit date within 2 months or at least by the end of the year - Patient counseled on mood disturbances associated with Chantix and agitation with nicotine withdrawal

## 2021-11-29 NOTE — Progress Notes (Signed)
S:   Chief Complaint  Patient presents with   Medication Management    Asthma and smoking cessation   [name] is a 41 y.o. female who presents for lung function evaluation. PMH is significant for tobacco use, SUD and seizures. Patient was referred and last seen by Primary Care Provider, Dr. Idalia Needle, on 11/21/21. At last visit, Reported using albuterol inhaler twice per week and expressed interest in tobacco cessation.   Today patient reports breathlessness, rates 5/10. Patient denies atopic sx. Reports never being hospitalized for an asthma exacerbation. Triggers include walking long distances, mowing the grass. Denies an allergic component to asthma.   Patient is interested in smoking cessation, recently reduced from 1 PPD to 1/2 PPD 3 months ago. Started smoking at 41 yo. Lives with 2 smokers in the house and smokes most frequently in the car. Never tried to quit, smokes within 30 minutes of waking up.   Importance of quitting smoking: 10/10 (important for her health) Confidence 6/10 (not a 10/10 because she lives with smokers)  Patient reports adherence to medications Patient reports last dose of asthma medications was 11/27/2021 Current asthma medications: Albuterol 2 puffs Q6h PRN SOB Rescue inhaler use frequency: Three times a week   Level of asthma sx control- in the last 4 weeks: Question Scoring Patient Score  Daytime sx > 2x/week Yes (1)   No (0) 1  Any nighttime waking due to asthma Yes (1)   No (0) 0  Reliever needed >2x/week Yes (1)   No (0) 1  Any activity limitation due to asthma Yes (1)   No (0) 1   Total Score 3  Well controlled - 0, Partly controlled - 1-2, Uncontrolled 3-4   O: Review of Systems  Respiratory:  Positive for shortness of breath.   All other systems reviewed and are negative.   Physical Exam Constitutional:      Appearance: Normal appearance. She is normal weight.  Neurological:     Mental Status: She is alert.  Psychiatric:         Mood and Affect: Mood normal.        Behavior: Behavior normal.        Thought Content: Thought content normal.     Vitals:   11/29/21 0959  BP: 124/75  Pulse: (!) 57    See "scanned report" or Documentation Flowsheet (discrete results - PFTs) for  Spirometry results. Patient provided good effort while attempting spirometry.   Albuterol Neb  Lot# [id number]     Exp. 10/2022   A/P: Moderate persistent asthma of > 15 years duration , experiencing breathlessness and taking albuterol PRN only. Medication adherence good. Spirometry evaluation with pre- and post-bronchodilator reveals normal lung function. Spirometry evaluation reveals restriction.  Post nebulized albuterol tx revealed improvement of lung function and subjective symptom improvement with patient reporting breathing was 8/10.  -continue Albuterol 2 puffs Q6h PRN shortness of breath and begin symbicort (budesonide/formoterol) 80/4.5  2 puffs BID and PRN shortness of breath -Educated patient on purpose, proper use, potential adverse effects including risk of esophageal candidiasis and need to rinse mouth after each use.  -Reviewed results of pulmonary function tests. Patient verbalized understanding of treatment plan.   Tobacco Abuse - longstanding with 20+ pack year history interested and motivate to attempt cessation.   - Initiate Chantix 0.5mg  daily for 3 days, then 0.5mg  BID - Set quit date within 2 months or at least by the end of the year - Patient counseled  on mood disturbances associated with Chantix and agitation with nicotine withdrawal   Written patient instructions provided.  Total time in face to face counseling 33 minutes.    Follow-up:  Pharmacist 4-6 weeks. PCP clinic visit with Dr. Idalia Needle on 12/21/21.  Patient seen with Cherie Ouch, PharmD Candidate, Rennis Petty, PharmD PGY-1 Resident, and Valeda Malm, PharmD, PGY2 Pharmacy Resident.  Marland Kitchen

## 2021-11-29 NOTE — Patient Instructions (Addendum)
It was great to see you today!  For your asthma, we are starting Symbicort inhaler 2 puffs twice daily. Please take this inhaler scheduled every day and rinse your mouth out after each dose. Use the Symbicort inhaler as needed for shortness of breath. If symptoms persist after Symbicort, you can use your albuterol as needed for persistent shortness of breath.  For smoking, start taking Chantix 0.5mg  (1 tablet) daily for 3 days, then increase to 0.5mg  (1 tablet) twice a day. This medication can cause vivid dreams. While quitting smoking you may feel agitated and irritable, this is normal.   Your next appointment is with Dr. Idalia Needle on 12/20/21

## 2021-11-30 NOTE — Progress Notes (Signed)
Reviewed: I agree with the documentation and management of Dr. Koval. 

## 2021-12-21 ENCOUNTER — Other Ambulatory Visit (HOSPITAL_COMMUNITY)
Admission: RE | Admit: 2021-12-21 | Discharge: 2021-12-21 | Disposition: A | Discharge status: Home / Self Care | Payer: Medicaid Other | Source: Ambulatory Visit | Attending: Family Medicine | Admitting: Family Medicine

## 2021-12-21 ENCOUNTER — Ambulatory Visit (INDEPENDENT_AMBULATORY_CARE_PROVIDER_SITE_OTHER): Payer: Medicaid Other | Admitting: Family Medicine

## 2021-12-21 DIAGNOSIS — N939 Abnormal uterine and vaginal bleeding, unspecified: Secondary | ICD-10-CM | POA: Diagnosis not present

## 2021-12-21 DIAGNOSIS — Z124 Encounter for screening for malignant neoplasm of cervix: Secondary | ICD-10-CM | POA: Diagnosis present

## 2021-12-21 DIAGNOSIS — R350 Frequency of micturition: Secondary | ICD-10-CM | POA: Diagnosis not present

## 2021-12-21 DIAGNOSIS — R21 Rash and other nonspecific skin eruption: Secondary | ICD-10-CM | POA: Diagnosis present

## 2021-12-21 MED ORDER — KETOCONAZOLE 2 % EX CREA: 1.0000 | TOPICAL_CREAM | Freq: Every day | CUTANEOUS | 0 refills | Status: AC

## 2021-12-21 NOTE — Progress Notes (Unsigned)
    SUBJECTIVE:   CHIEF COMPLAINT / HPI:   Patient presents for pap smear. She endorses spotting and urinary frequency that started yesterday. States this is how UTIs have presented in the past. Denies vaginal discharge, dispareunia. Denies new partners. LMP 8/8. Usually gets menstrual cycle every month. Tubes are tied.    Additionally she endorses a rash on her face that started 3 days ago. Using A & D ointment and an athletes foot medication for the past 2 days without improvement Has had a similar rash 1 month ago . Before used A and D ointment with improvement. Itches but is not painful.   PERTINENT  PMH / PSH: Reviewed   OBJECTIVE:   LMP 11/14/2021    General: alert, pleasant, NAD CV: RRR no murmurs Resp: CTAB normal WOB GI: soft, non distended Pelvic exam: normal external genitalia, vulva, vagina, cervix Derm: erythematous circular rash on L side of face with raised borders    ASSESSMENT/PLAN:   Rash Patient with 3 days of circular erythematous rash with raised borders. Likely tinea faciei given appearance and pruritis. Discoid lupus also on differential. Will trial treatment with Ketoconazole cream daily for 14 days. Return precautions discussed if no improvement after 2 weeks  Vaginal spotting Patient presents with 2 days of spotting and urinary frequency which is how her UTIs in the past have presented. LMP 8/8. Contraception with tubal ligation.  - U preg - UA + urine culture    Cervical cancer screening  Pap smear completed today with STI testing. Patient has tubal ligation. Physical exam unremarkable.   Cora Collum, DO [facility]

## 2021-12-21 NOTE — Patient Instructions (Signed)
It was great seeing you today!  Today we did your Pap smear, checked for STDs, and also checked your urine for infection.  I will call you if anything is abnormal, or we will send a MyChart message if normal.  Your rash it is likely a fungal infection so I prescribed ketoconazole cream to use daily for 2 weeks.  If after 2 weeks the rash does not improve, please give me a call.  Visit Reminders: - Stop by the pharmacy to pick up your prescriptions  - Continue to work on your healthy eating habits and incorporating exercise into your daily life.   Feel free to call with any questions or concerns at any time, at [phone].   Take care,  Dr. Cora Collum [facility]

## 2021-12-23 DIAGNOSIS — R21 Rash and other nonspecific skin eruption: Secondary | ICD-10-CM | POA: Insufficient documentation

## 2021-12-23 DIAGNOSIS — N939 Abnormal uterine and vaginal bleeding, unspecified: Secondary | ICD-10-CM | POA: Insufficient documentation

## 2021-12-23 NOTE — Assessment & Plan Note (Signed)
Patient with 3 days of circular erythematous rash with raised borders. Likely tinea faciei given appearance and pruritis. Discoid lupus also on differential. Will trial treatment with Ketoconazole cream daily for 14 days. Return precautions discussed if no improvement after 2 weeks

## 2021-12-23 NOTE — Assessment & Plan Note (Signed)
Patient presents with 2 days of spotting and urinary frequency which is how her UTIs in the past have presented. LMP 8/8. Contraception with tubal ligation.  - U preg - UA + urine culture

## 2021-12-24 ENCOUNTER — Other Ambulatory Visit (INDEPENDENT_AMBULATORY_CARE_PROVIDER_SITE_OTHER): Payer: Medicaid Other

## 2021-12-24 DIAGNOSIS — N939 Abnormal uterine and vaginal bleeding, unspecified: Secondary | ICD-10-CM | POA: Diagnosis not present

## 2021-12-24 DIAGNOSIS — R350 Frequency of micturition: Secondary | ICD-10-CM | POA: Diagnosis present

## 2021-12-24 LAB — POCT URINALYSIS DIP (MANUAL ENTRY)
Bilirubin, UA: NEGATIVE
Blood, UA: NEGATIVE
Glucose, UA: NEGATIVE mg/dL
Ketones, POC UA: NEGATIVE mg/dL
Leukocytes, UA: NEGATIVE
Nitrite, UA: NEGATIVE
Protein Ur, POC: NEGATIVE mg/dL
Spec Grav, UA: >=1.03 — AB (ref 1.010–1.025)
Urobilinogen, UA: 0.2 E.U./dL
pH, UA: 5.5 (ref 5.0–8.0)

## 2021-12-24 LAB — POCT URINE PREGNANCY: Preg Test, Ur: NEGATIVE
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
## 2021-12-25 ENCOUNTER — Ambulatory Visit: Payer: Medicaid Other | Admitting: Neurology

## 2021-12-26 LAB — CYTOLOGY - PAP
Chlamydia: NEGATIVE
Comment: NEGATIVE
Comment: NEGATIVE
Comment: NEGATIVE
Comment: NORMAL
Diagnosis: NEGATIVE
High risk HPV: NEGATIVE
Neisseria Gonorrhea: NEGATIVE
Trichomonas: NEGATIVE

## 2021-12-26 LAB — URINE CULTURE

## 2022-02-21 ENCOUNTER — Other Ambulatory Visit: Payer: Self-pay | Admitting: Family Medicine

## 2022-02-21 DIAGNOSIS — Z720 Tobacco use: Secondary | ICD-10-CM

## 2022-02-22 ENCOUNTER — Other Ambulatory Visit: Payer: Self-pay | Admitting: Family Medicine

## 2022-02-22 DIAGNOSIS — J4540 Moderate persistent asthma, uncomplicated: Secondary | ICD-10-CM

## 2022-09-27 ENCOUNTER — Other Ambulatory Visit: Payer: Self-pay | Admitting: Family Medicine

## 2023-08-19 ENCOUNTER — Ambulatory Visit (HOSPITAL_COMMUNITY)
Admission: EM | Admit: 2023-08-19 | Discharge: 2023-08-19 | Disposition: A | Discharge status: Home / Self Care | Payer: MEDICAID

## 2023-08-19 ENCOUNTER — Encounter (HOSPITAL_COMMUNITY): Payer: Self-pay

## 2023-08-19 DIAGNOSIS — M79671 Pain in right foot: Secondary | ICD-10-CM

## 2023-08-19 DIAGNOSIS — L84 Corns and callosities: Secondary | ICD-10-CM

## 2023-08-19 NOTE — ED Provider Notes (Signed)
 [facility]    CSN: [id number] Arrival date & time: 08/19/23  1040      History   Chief Complaint Chief Complaint  Patient presents with   Foot Pain    HPI [name] is a 43 y.o. female.   Patient presents with pain to right fourth toe and fifth toe x 3 weeks.  Patient states the pain radiates down the side of her foot.  Patient denies any known injury or falls.  Patient states she believes she has a corn on her toe that is causing her pain.  Patient reports she has been taking ibuprofen  and using over-the-counter corn patches with some relief.   Foot Pain    Past Medical History:  Diagnosis Date   Asthma    History of opioid abuse (HCC)    currently on Methadone     Insomnia    Seizures (HCC)     Patient Active Problem List   Diagnosis Date Noted   Rash 12/23/2021   Vaginal spotting 12/23/2021   Tobacco use 11/22/2021   History of substance use disorder 11/22/2021   History of recurrent UTIs 11/22/2021   Seizures (HCC) 03/16/2018   Seizure (HCC) 02/28/2018   Pyelonephritis 07/28/2017   Asthma, moderate persistent, poorly-controlled 07/24/2015   Routine health maintenance 07/24/2015   Pain, dental 12/25/2012    Past Surgical History:  Procedure Laterality Date   abscess surgery     TONSILLECTOMY     TUBAL LIGATION      OB History   No obstetric history on file.      Home Medications    Prior to Admission medications   Medication Sig Start Date End Date Taking? Authorizing Provider  albuterol  (VENTOLIN  HFA) 108 (90 Base) MCG/ACT inhaler Inhale 2 puffs into the lungs every 6 (six) hours as needed for wheezing or shortness of breath.    [provider]  levETIRAcetam  (KEPPRA  XR) 500 MG 24 hr tablet Take 1,500 mg by mouth at bedtime. 09/10/21   [provider]  SYMBICORT  80-4.5 MCG/ACT inhaler Inhale 2 puffs into the lungs 2 (two) times daily. And as needed for shortness of breath 02/25/22   Irwin Manual, DO   zolpidem (AMBIEN) 10 MG tablet Take 10 mg by mouth daily as needed (sleep).    [provider]    Family History Family History  Problem Relation Age of Onset   Hypertension Mother    Other Father        fatty liver, reports he had a seizure when he died    Social History Social History   Tobacco Use   Smoking status: Every Day    Current packs/day: 0.50    Types: Cigarettes   Smokeless tobacco: Never  Substance Use Topics   Alcohol use: No   Drug use: No     Allergies   Patient has no known allergies.   Review of Systems Review of Systems  Per HPI  Physical Exam Triage Vital Signs ED Triage Vitals [08/19/23 1140]  Encounter Vitals Group     BP (!) 165/80     Systolic BP Percentile      Diastolic BP Percentile      Pulse Rate (!) 53     Resp 16     Temp 98.7 F (37.1 C)     Temp Source Oral     SpO2 97 %     Weight 172 lb (78 kg)     Height 5\' 6"  (1.676  m)     Head Circumference      Peak Flow      Pain Score 6     Pain Loc      Pain Education      Exclude from Growth Chart    No data found.  Updated Vital Signs BP (!) 165/80 (BP Location: Right Arm)   Pulse (!) 53   Temp 98.7 F (37.1 C) (Oral)   Resp 16   Ht 5\' 6"  (1.676 m)   Wt 172 lb (78 kg)   LMP  (LMP Unknown)   SpO2 97%   BMI 27.76 kg/m   Visual Acuity Right Eye Distance:   Left Eye Distance:   Bilateral Distance:    Right Eye Near:   Left Eye Near:    Bilateral Near:     Physical Exam Vitals and nursing note reviewed.  Constitutional:      General: She is awake. She is not in acute distress.    Appearance: Normal appearance. She is well-developed and well-groomed. She is not ill-appearing.  Skin:    General: Skin is warm and dry.     Comments: Corn noted to the lateral aspect of the fourth toe on the right foot without surrounding erythema, swelling, or drainage.  Tenderness noted to lateral aspect of fourth toe and medial aspect of fifth toe.  Neurological:      Mental Status: She is alert.  Psychiatric:        Behavior: Behavior is cooperative.      UC Treatments / Results  Labs (all labs ordered are listed, but only abnormal results are displayed) Labs Reviewed - No data to display  EKG   Radiology No results found.  Procedures Procedures (including critical care time)  Medications Ordered in UC Medications - No data to display  Initial Impression / Assessment and Plan / UC Course  I have reviewed the triage vital signs and the nursing notes.  Pertinent labs & imaging results that were available during my care of the patient were reviewed by me and considered in my medical decision making (see chart for details).     Patient is well-appearing.  Vitals are stable.  Upon assessment there is a corn noted to the lateral aspect of the fourth toe on the right foot without surrounding erythema, swelling, or drainage.  Tenderness noted to the lateral aspect of the fourth toe and medial aspect of the fifth toe.  Recommended patient continue using Dr. Randon Butters corn pads and alternate between Tylenol  and ibuprofen  as needed for home.  Recommended following up with [facility].  Discussed return precautions. Final Clinical Impressions(s) / UC Diagnoses   Final diagnoses:  Corn of toe  Foot pain, right     Discharge Instructions      Continue using Dr. Randon Butters corn pads. Alternate between 650 mg of Tylenol  and 400 mg of ibuprofen  every 6-8 hours as needed for pain. Follow-up with [facility] if pain persists. Return here as needed.    ED Prescriptions   None    PDMP not reviewed this encounter.   Levora Reas A, NP 08/19/23 1304

## 2023-08-19 NOTE — Discharge Instructions (Addendum)
 Continue using Dr. Randon Butters corn pads. Alternate between 650 mg of Tylenol  and 400 mg of ibuprofen  every 6-8 hours as needed for pain. Follow-up with [facility] if pain persists. Return here as needed.

## 2023-08-19 NOTE — ED Triage Notes (Signed)
 Patient here today with c/o right foot pain X 3 weeks. No known injury. She has been taking Ibuprofen  with no relief. Patient has also been using some patched for a corn but not helping.

## 2023-10-10 ENCOUNTER — Ambulatory Visit (INDEPENDENT_AMBULATORY_CARE_PROVIDER_SITE_OTHER): Payer: MEDICAID | Admitting: Family Medicine

## 2023-10-10 ENCOUNTER — Encounter: Payer: Self-pay | Admitting: Family Medicine

## 2023-10-10 ENCOUNTER — Ambulatory Visit (HOSPITAL_COMMUNITY)
Admission: RE | Admit: 2023-10-10 | Discharge: 2023-10-10 | Disposition: A | Discharge status: Home / Self Care | Payer: MEDICAID | Source: Ambulatory Visit | Attending: Family Medicine | Admitting: Family Medicine

## 2023-10-10 VITALS — BP 126/76 | HR 62 | Wt 170.0 lb

## 2023-10-10 DIAGNOSIS — J4540 Moderate persistent asthma, uncomplicated: Secondary | ICD-10-CM | POA: Diagnosis not present

## 2023-10-10 DIAGNOSIS — R0609 Other forms of dyspnea: Secondary | ICD-10-CM | POA: Diagnosis present

## 2023-10-10 LAB — POCT GLYCOSYLATED HEMOGLOBIN (HGB A1C): Hemoglobin A1C: 5.4 % (ref 4.0–5.6)

## 2023-10-10 MED ORDER — ALBUTEROL SULFATE HFA 108 (90 BASE) MCG/ACT IN AERS
2.0000 | INHALATION_SPRAY | Freq: Four times a day (QID) | RESPIRATORY_TRACT | 3 refills | Status: AC | PRN
Start: 2023-10-10 — End: ?

## 2023-10-10 NOTE — Progress Notes (Unsigned)
    SUBJECTIVE:   CHIEF COMPLAINT / HPI:   SE is a 43yo F w/ hx of asthma that pf abnormal EKG results. - [facility] Was told it was abnormal and advised to f/u w/ PCP.  - Denies any symptoms at the time. It was a yearly physical - Has family hx of heart attacks. Mom had to get cath.  - Denies any known hx of MI.  - Does feel like she's been more lightheaded recently and getting winded with activity easily.  - Has hx of seizure, but no recent LOC.  - Denies any chest pain, but does get heartburn. - has been abstinent for 15 yrs from pain pills. Denies current use - Tobacco: <1 ppd, has been smoking ~20 yrs  PERTINENT  PMH / PSH: asthma  OBJECTIVE:   BP 126/76   Pulse 62   Wt 170 lb (77.1 kg)   LMP 09/14/2023   SpO2 98%   BMI 27.44 kg/m   General: Alert, pleasant woman. NAD. HEENT: NCAT. MMM. CV: RRR, no murmurs.  Resp: CTAB, no wheezing or crackles. Normal WOB on RA.  Abm: Soft, nontender, nondistended. BS present. Ext: Moves all ext spontaneously Skin: Warm, well perfused   ASSESSMENT/PLAN:   Assessment & Plan Dyspnea on exertion Differential includes asthma, arrhythmia, structural cardiac abnormalities, hypothyroidism. Repeat EKG today shows NSR w/o ST changes; overall reassuring. No signs of fluid overload on exam and no murmurs, but will check echo given recent dyspnea on exertion and strong family hx of CAD, and active tobacco use.  - Refill for albuterol  inhaler. Advised to use when dyspnea occurs and see if it helps - BMP, CBC, TSH, A1c, lipid - echocardiogram - counseled on tobacco cessation - ED precautions counseled   Albin Huh, MD [facility]

## 2023-10-10 NOTE — Patient Instructions (Signed)
 Good to see you today - Thank you for coming in  Things we discussed today:  1) Your repeat EKG looks better, but you do have a slightly slower heart rate. Some people can have naturally slower heart rates, but don't necessarily need treatment for it. We will get labs and an echocardiogram of your heart to check for any other concerns.  2) For your shortness of breath, I recommend using your albuterol  inhaler to see if it helps. - Trying to stop smoking altogether would have incredible benefits for your breathing, heart health, and overall health. Please let me know if you are interested in smoking cessation help such as medications.  Please seek further medical attention if you: - have trouble breathing - have chest pain - lose consciousness or feel close to fainting    If any of your results are abnormal, I will reach out via MyChart or phone.

## 2023-10-15 LAB — BASIC METABOLIC PANEL WITH GFR

## 2023-10-15 LAB — LIPID PANEL

## 2023-10-16 ENCOUNTER — Other Ambulatory Visit: Payer: Self-pay | Admitting: Family Medicine

## 2023-10-16 DIAGNOSIS — R0602 Shortness of breath: Secondary | ICD-10-CM

## 2023-10-17 LAB — CBC

## 2023-10-17 LAB — LIPID PANEL

## 2023-10-17 LAB — TSH

## 2023-10-17 LAB — BASIC METABOLIC PANEL WITH GFR

## 2023-10-22 ENCOUNTER — Other Ambulatory Visit: Payer: MEDICAID

## 2023-10-22 DIAGNOSIS — R0602 Shortness of breath: Secondary | ICD-10-CM

## 2023-10-23 ENCOUNTER — Ambulatory Visit: Payer: Self-pay | Admitting: Family Medicine

## 2023-10-23 LAB — CBC
Hematocrit: 37.8 % (ref 34.0–46.6)
Hemoglobin: 12.2 g/dL (ref 11.1–15.9)
MCH: 29.5 pg (ref 26.6–33.0)
MCHC: 32.3 g/dL (ref 31.5–35.7)
MCV: 92 fL (ref 79–97)
Platelets: 236 10*3/uL (ref 150–450)
RBC: 4.13 x10E6/uL (ref 3.77–5.28)
RDW: 13.7 % (ref 11.7–15.4)
WBC: 6 10*3/uL (ref 3.4–10.8)

## 2023-10-23 LAB — LIPID PANEL
Chol/HDL Ratio: 3 ratio (ref 0.0–4.4)
Cholesterol, Total: 128 mg/dL (ref 100–199)
HDL: 42 mg/dL (ref >39)
LDL Chol Calc (NIH): 70 mg/dL (ref 0–99)
Triglycerides: 84 mg/dL (ref 0–149)
VLDL Cholesterol Cal: 16 mg/dL (ref 5–40)

## 2023-10-23 LAB — BASIC METABOLIC PANEL WITH GFR
BUN/Creatinine Ratio: 12 (ref 9–23)
BUN: 10 mg/dL (ref 6–24)
CO2: 22 mmol/L (ref 20–29)
Calcium: 9.5 mg/dL (ref 8.7–10.2)
Chloride: 102 mmol/L (ref 96–106)
Creatinine, Ser: 0.82 mg/dL (ref 0.57–1.00)
Glucose: 137 mg/dL — ABNORMAL HIGH (ref 70–99)
Potassium: 4.9 mmol/L (ref 3.5–5.2)
Sodium: 141 mmol/L (ref 134–144)
eGFR: 92 mL/min/{1.73_m2} (ref >59)

## 2023-10-23 LAB — TSH: TSH: 1.76 u[IU]/mL (ref 0.450–4.500)

## 2024-01-07 ENCOUNTER — Encounter: Payer: Self-pay | Admitting: Podiatry

## 2024-01-07 ENCOUNTER — Ambulatory Visit (INDEPENDENT_AMBULATORY_CARE_PROVIDER_SITE_OTHER): Payer: MEDICAID | Admitting: Podiatry

## 2024-01-07 ENCOUNTER — Ambulatory Visit (INDEPENDENT_AMBULATORY_CARE_PROVIDER_SITE_OTHER): Payer: MEDICAID

## 2024-01-07 DIAGNOSIS — M7751 Other enthesopathy of right foot: Secondary | ICD-10-CM | POA: Diagnosis not present

## 2024-01-07 DIAGNOSIS — M2041 Other hammer toe(s) (acquired), right foot: Secondary | ICD-10-CM

## 2024-01-07 MED ORDER — TRIAMCINOLONE ACETONIDE 10 MG/ML IJ SUSP
10.0000 mg | Freq: Once | INTRAMUSCULAR | Status: AC
  Administered 2024-01-07: 10 mg via INTRA_ARTICULAR

## 2024-01-07 NOTE — Progress Notes (Signed)
 Subjective:   Patient ID: [name], female   DOB: 43 y.o.   MRN: [id number]   HPI Patient states she has had a very painful corn between the 4th and 5th digits on her right foot with fluid buildup and it is making it hard for her to wear shoe gear comfortably   Review of Systems  All other systems reviewed and are negative.       Objective:  Physical Exam Vitals and nursing note reviewed.  Constitutional:      Appearance: She is well-developed.  Pulmonary:     Effort: Pulmonary effort is normal.  Musculoskeletal:        General: Normal range of motion.  Skin:    General: Skin is warm.  Neurological:     Mental Status: She is alert.   Neurovascular status found to be intact muscle strength found to be adequate range of motion adequate with fluid buildup of the inner phalangeal joint digit 4 right pressing against the fifth toe with rotational component to the fifth digit.  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
